# Patient Record
Sex: Male | Born: 1935 | Race: White | Hispanic: No | Marital: Married | Smoking: Former smoker
Health system: Southern US, Community
[De-identification: ages and names within clinical notes are randomized; demographics above are authoritative.]

## PROBLEM LIST (undated history)

## (undated) DIAGNOSIS — K5792 Diverticulitis of intestine, part unspecified, without perforation or abscess without bleeding: Secondary | ICD-10-CM

## (undated) DIAGNOSIS — C439 Malignant melanoma of skin, unspecified: Secondary | ICD-10-CM

## (undated) DIAGNOSIS — C9200 Acute myeloblastic leukemia, not having achieved remission: Principal | ICD-10-CM

## (undated) DIAGNOSIS — C4491 Basal cell carcinoma of skin, unspecified: Secondary | ICD-10-CM

## (undated) HISTORY — DX: Malignant melanoma of skin, unspecified: C43.9

## (undated) HISTORY — DX: Basal cell carcinoma of skin, unspecified: C44.91

## (undated) HISTORY — PX: CATARACT EXTRACTION W/ INTRAOCULAR LENS  IMPLANT, BILATERAL: SHX1307

## (undated) HISTORY — DX: Acute myeloblastic leukemia, not having achieved remission: C92.00

## (undated) HISTORY — PX: OTHER SURGICAL HISTORY: SHX169

## (undated) HISTORY — DX: Diverticulitis of intestine, part unspecified, without perforation or abscess without bleeding: K57.92

---
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
## 2000-07-23 ENCOUNTER — Emergency Department (HOSPITAL_COMMUNITY): Admission: EM | Admit: 2000-07-23 | Discharge: 2000-07-23 | Payer: Self-pay

## 2003-10-25 DIAGNOSIS — C4491 Basal cell carcinoma of skin, unspecified: Secondary | ICD-10-CM

## 2003-10-25 HISTORY — DX: Basal cell carcinoma of skin, unspecified: C44.91

## 2007-05-14 ENCOUNTER — Ambulatory Visit: Payer: Self-pay | Admitting: Internal Medicine

## 2007-05-28 ENCOUNTER — Ambulatory Visit: Payer: Self-pay | Admitting: Internal Medicine

## 2007-05-28 ENCOUNTER — Encounter: Payer: Self-pay | Admitting: Internal Medicine

## 2007-11-23 ENCOUNTER — Ambulatory Visit: Payer: Self-pay | Admitting: Vascular Surgery

## 2011-03-08 NOTE — Procedures (Signed)
CAROTID DUPLEX EXAM   INDICATION:  Followup evaluation of known carotid artery disease.   HISTORY:  Diabetes:  No.  Cardiac:  No.  Hypertension:  No.  Smoking:  Periodic smoking of cigars.  Previous Surgery:  No.  CV History:  The patient reports no cerebrovascular symptoms, however,  had an abnormal lifeline screening examination performed 3 years ago.  Amaurosis Fugax No, Paresthesias No, Hemiparesis No                                       RIGHT             LEFT  Brachial systolic pressure:         130               126  Brachial Doppler waveforms:         Triphasic         Triphasic  Vertebral direction of flow:        Antegrade         Antegrade  DUPLEX VELOCITIES (cm/sec)  CCA peak systolic                   103               132  ECA peak systolic                   75                47  ICA peak systolic                   56                49  ICA end diastolic                   9                 12  PLAQUE MORPHOLOGY:                  Soft              Soft  PLAQUE AMOUNT:                      Minimal           Minimal  PLAQUE LOCATION:                    CCA, proximal ICA proximal ICA, CCA   IMPRESSION:  20-39% ICA stenosis bilaterally.   ___________________________________________  Quita Skye Hart [place], M.D.   MC/MEDQ  D:  11/23/2007  T:  11/24/2007  Job:  [id number]

## 2011-06-13 ENCOUNTER — Other Ambulatory Visit: Payer: Self-pay | Admitting: Dermatology

## 2012-06-20 ENCOUNTER — Telehealth: Payer: Self-pay | Admitting: Internal Medicine

## 2012-06-20 NOTE — Telephone Encounter (Signed)
Discussed with pt that his last colon was done in 2008 and it had been 5 years. Pt scheduled for previsit 07/03/12@3 :30pm, colon scheduled for 07/17/12@3 :30pm. Pt aware of appt dates and times.

## 2012-07-03 ENCOUNTER — Ambulatory Visit (AMBULATORY_SURGERY_CENTER): Payer: Medicare Other | Admitting: *Deleted

## 2012-07-03 VITALS — Ht 71.0 in | Wt 165.0 lb

## 2012-07-03 DIAGNOSIS — Z1211 Encounter for screening for malignant neoplasm of colon: Secondary | ICD-10-CM

## 2012-07-03 MED ORDER — MOVIPREP 100 G PO SOLR: ORAL | Status: DC

## 2012-07-16 ENCOUNTER — Telehealth: Payer: Self-pay | Admitting: *Deleted

## 2012-07-16 NOTE — Telephone Encounter (Signed)
Moved patient to an earlier available appointment.  Pt now scheduled to arrive at 13:30 for a 14:30 appointment.  Pt aware that he must start drinking his 2nd dose of his prep an hour earlier than he was originally instructed to.

## 2012-07-17 ENCOUNTER — Encounter: Payer: Medicare Other | Admitting: Internal Medicine

## 2012-07-17 ENCOUNTER — Ambulatory Visit (AMBULATORY_SURGERY_CENTER): Payer: Medicare Other | Admitting: Internal Medicine

## 2012-07-17 ENCOUNTER — Encounter: Payer: Self-pay | Admitting: Internal Medicine

## 2012-07-17 VITALS — BP 130/91 | HR 58 | Temp 97.5°F | Resp 18 | Ht 71.0 in | Wt 165.0 lb

## 2012-07-17 DIAGNOSIS — Z86010 Personal history of colonic polyps: Secondary | ICD-10-CM

## 2012-07-17 DIAGNOSIS — Z1211 Encounter for screening for malignant neoplasm of colon: Secondary | ICD-10-CM

## 2012-07-17 DIAGNOSIS — D126 Benign neoplasm of colon, unspecified: Secondary | ICD-10-CM

## 2012-07-17 DIAGNOSIS — K635 Polyp of colon: Secondary | ICD-10-CM

## 2012-07-17 MED ORDER — SODIUM CHLORIDE 0.9 % IV SOLN: 500.0000 mL | INTRAVENOUS | Status: DC

## 2012-07-17 NOTE — Progress Notes (Signed)
DR. Marina Goodell AND MARK SMITH CRNA MADE AWARE PRIOR TO PT. GOING TO PROCEDURE ROOM, THAT PT. DRUNK TEA AT 12:30.

## 2012-07-17 NOTE — Progress Notes (Addendum)
Patient did not have preoperative order for IV antibiotic SSI prophylaxis. (G8918)  Patient did not experience any of the following events: a burn prior to discharge; a fall within the facility; wrong site/side/patient/procedure/implant event; or a hospital transfer or hospital admission upon discharge from the facility. (G8907)  

## 2012-07-17 NOTE — Patient Instructions (Addendum)
YOU HAD AN ENDOSCOPIC PROCEDURE TODAY AT THE [facility]: Refer to the procedure report that was given to you for any specific questions about what was found during the examination.  If the procedure report does not answer your questions, please call your gastroenterologist to clarify.  If you requested that your care partner not be given the details of your procedure findings, then the procedure report has been included in a sealed envelope for you to review at your convenience later.  YOU SHOULD EXPECT: Some feelings of bloating in the abdomen. Passage of more gas than usual.  Walking can help get rid of the air that was put into your GI tract during the procedure and reduce the bloating. If you had a lower endoscopy (such as a colonoscopy or flexible sigmoidoscopy) you may notice spotting of blood in your stool or on the toilet paper. If you underwent a bowel prep for your procedure, then you may not have a normal bowel movement for a few days.  DIET: Your first meal following the procedure should be a light meal and then it is ok to progress to your normal diet.  A half-sandwich or bowl of soup is an example of a good first meal.  Heavy or fried foods are harder to digest and may make you feel nauseous or bloated.  Likewise meals heavy in dairy and vegetables can cause extra gas to form and this can also increase the bloating.  Drink plenty of fluids but you should avoid alcoholic beverages for 24 hours.  ACTIVITY: Your care partner should take you home directly after the procedure.  You should plan to take it easy, moving slowly for the rest of the day.  You can resume normal activity the day after the procedure however you should NOT DRIVE or use heavy machinery for 24 hours (because of the sedation medicines used during the test).    SYMPTOMS TO REPORT IMMEDIATELY: A gastroenterologist can be reached at any hour.  During normal business hours, 8:30 AM to 5:00 PM Monday through Friday,  call [phone].  After hours and on weekends, please call the GI answering service at [phone] who will take a message and have the physician on call contact you.   Following lower endoscopy (colonoscopy or flexible sigmoidoscopy):  Excessive amounts of blood in the stool  Significant tenderness or worsening of abdominal pains  Swelling of the abdomen that is new, acute  Fever of 100F or higher  FOLLOW UP: If any biopsies were taken you will be contacted by phone or by letter within the next 1-3 weeks.  Call your gastroenterologist if you have not heard about the biopsies in 3 weeks.  Our staff will call the home number listed on your records the next business day following your procedure to check on you and address any questions or concerns that you may have at that time regarding the information given to you following your procedure. This is a courtesy call and so if there is no answer at the home number and we have not heard from you through the emergency physician on call, we will assume that you have returned to your regular daily activities without incident.  SIGNATURES/CONFIDENTIALITY: You and/or your care partner have signed paperwork which will be entered into your electronic medical record.  These signatures attest to the fact that that the information above on your After Visit Summary has been reviewed and is understood.  Full responsibility of the confidentiality of this   discharge information lies with you and/or your care-partner.   Thank-you for choosing us for your healthcare needs. 

## 2012-07-17 NOTE — Op Note (Signed)
[facility] [address] [place], [address]   COLONOSCOPY PROCEDURE REPORT  PATIENT: [name], [name]  MR#: [id number] BIRTHDATE: 02-19-1928 , 83  yrs. old GENDER: Male ENDOSCOPIST: Roxy Cedar, MD REFERRED XB:[id number] Program Recall PROCEDURE DATE:  07/17/2012 PROCEDURE:   Colonoscopy with snare polypectomy    x 4 ASA CLASS:   Class II INDICATIONS:patient's personal history of adenomatous colon polyps.  MEDICATIONS: MAC sedation, administered by CRNA and propofol (Diprivan) 80mg  IV  DESCRIPTION OF PROCEDURE:   After the risks benefits and alternatives of the procedure were thoroughly explained, informed consent was obtained.  A digital rectal exam revealed no abnormalities of the rectum.   The LB CF-[id number]  endoscope was introduced through the anus and advanced to the cecum, which was identified by both the appendix and ileocecal valve. No adverse events experienced.   The quality of the prep was adequate, using MoviPrep  The instrument was then slowly withdrawn as the colon was fully examined.      COLON FINDINGS: Four polyps ranging between 3-27mm in size were found in the ascending colon and transverse colon.  A polypectomy was performed with a cold snare.  The resection was complete and the polyp tissue was completely retrieved.   Severe diverticulosis was noted throughout the entire examined colon.  Retroflexed views revealed no abnormalities. The time to cecum=2 minutes 15 seconds. Withdrawal time=13 minutes 09 seconds.  The scope was withdrawn and the procedure completed. COMPLICATIONS: There were no complications.  ENDOSCOPIC IMPRESSION: 1.   Four polyps ranging between 3-21mm in size were found in the ascending colon and transverse colon; polypectomy was performed with a cold snare 2.   Severe diverticulosis was noted throughout the entire examined colon  RECOMMENDATIONS: 1. Return to the care of your primary provider.  GI  follow up as needed   eSigned:  Roxy Cedar, MD 07/17/2012 2:21 PM   cc: Geoffry Paradise, MD and The Patient   PATIENT NAME:  Shawn Goodman, Shawn Goodman MR#: [id number]

## 2012-07-18 ENCOUNTER — Telehealth: Payer: Self-pay | Admitting: *Deleted

## 2012-07-18 NOTE — Telephone Encounter (Signed)
Left message that we called for f/u 

## 2012-07-23 ENCOUNTER — Encounter: Payer: Self-pay | Admitting: Internal Medicine

## 2012-11-05 ENCOUNTER — Other Ambulatory Visit: Payer: Self-pay | Admitting: Dermatology

## 2012-11-24 DIAGNOSIS — C439 Malignant melanoma of skin, unspecified: Secondary | ICD-10-CM

## 2012-11-24 HISTORY — DX: Malignant melanoma of skin, unspecified: C43.9

## 2012-12-22 DIAGNOSIS — C9200 Acute myeloblastic leukemia, not having achieved remission: Secondary | ICD-10-CM

## 2012-12-22 HISTORY — DX: Acute myeloblastic leukemia, not having achieved remission: C92.00

## 2013-01-05 ENCOUNTER — Emergency Department (HOSPITAL_BASED_OUTPATIENT_CLINIC_OR_DEPARTMENT_OTHER): Payer: Medicare Other

## 2013-01-05 ENCOUNTER — Inpatient Hospital Stay (HOSPITAL_BASED_OUTPATIENT_CLINIC_OR_DEPARTMENT_OTHER)
Admission: EM | Admit: 2013-01-05 | Discharge: 2013-01-10 | DRG: 834 | Disposition: A | Discharge status: Other Acute Inpatient Hospital | Payer: Medicare Other | Attending: Internal Medicine | Admitting: Internal Medicine

## 2013-01-05 ENCOUNTER — Encounter (HOSPITAL_BASED_OUTPATIENT_CLINIC_OR_DEPARTMENT_OTHER): Payer: Self-pay | Admitting: *Deleted

## 2013-01-05 DIAGNOSIS — D649 Anemia, unspecified: Secondary | ICD-10-CM

## 2013-01-05 DIAGNOSIS — N400 Enlarged prostate without lower urinary tract symptoms: Secondary | ICD-10-CM | POA: Diagnosis present

## 2013-01-05 DIAGNOSIS — R509 Fever, unspecified: Secondary | ICD-10-CM

## 2013-01-05 DIAGNOSIS — N390 Urinary tract infection, site not specified: Secondary | ICD-10-CM

## 2013-01-05 DIAGNOSIS — R5081 Fever presenting with conditions classified elsewhere: Secondary | ICD-10-CM | POA: Diagnosis present

## 2013-01-05 DIAGNOSIS — J322 Chronic ethmoidal sinusitis: Secondary | ICD-10-CM

## 2013-01-05 DIAGNOSIS — Z87891 Personal history of nicotine dependence: Secondary | ICD-10-CM

## 2013-01-05 DIAGNOSIS — C4330 Malignant melanoma of unspecified part of face: Secondary | ICD-10-CM | POA: Diagnosis present

## 2013-01-05 DIAGNOSIS — C9200 Acute myeloblastic leukemia, not having achieved remission: Principal | ICD-10-CM | POA: Diagnosis present

## 2013-01-05 DIAGNOSIS — J189 Pneumonia, unspecified organism: Secondary | ICD-10-CM

## 2013-01-05 DIAGNOSIS — D709 Neutropenia, unspecified: Secondary | ICD-10-CM

## 2013-01-05 LAB — COMPREHENSIVE METABOLIC PANEL
ALT: 14 U/L (ref 0–53)
AST: 16 U/L (ref 0–37)
Albumin: 3.3 g/dL — ABNORMAL LOW (ref 3.5–5.2)
CO2: 24 mEq/L (ref 19–32)
Calcium: 9.1 mg/dL (ref 8.4–10.5)
Chloride: 100 mEq/L (ref 96–112)
Creatinine, Ser: 1.1 mg/dL (ref 0.50–1.35)
Sodium: 136 mEq/L (ref 135–145)
Total Bilirubin: 0.3 mg/dL (ref 0.3–1.2)

## 2013-01-05 LAB — CBC WITH DIFFERENTIAL/PLATELET
Basophils Relative: 1 % (ref 0–1)
Eosinophils Relative: 3 % (ref 0–5)
Lymphocytes Relative: 67 % — ABNORMAL HIGH (ref 12–46)
MCV: 113.8 fL — ABNORMAL HIGH (ref 78.0–100.0)
Monocytes Absolute: 0.1 10*3/uL (ref 0.1–1.0)
Neutro Abs: 0.4 10*3/uL — ABNORMAL LOW (ref 1.7–7.7)
Neutrophils Relative %: 24 % — ABNORMAL LOW (ref 43–77)
Platelets: 173 10*3/uL (ref 150–400)
RDW: 13.5 % (ref 11.5–15.5)
WBC: 1.5 10*3/uL — ABNORMAL LOW (ref 4.0–10.5)

## 2013-01-05 LAB — URINALYSIS, ROUTINE W REFLEX MICROSCOPIC
Glucose, UA: NEGATIVE mg/dL
Hgb urine dipstick: NEGATIVE
Specific Gravity, Urine: 1.027 (ref 1.005–1.030)
Urobilinogen, UA: 1 mg/dL (ref 0.0–1.0)

## 2013-01-05 LAB — URINE MICROSCOPIC-ADD ON

## 2013-01-05 MED ORDER — SODIUM CHLORIDE 0.9 % IV SOLN
Freq: Once | INTRAVENOUS | Status: AC
  Administered 2013-01-05: via INTRAVENOUS

## 2013-01-05 MED ORDER — PIPERACILLIN-TAZOBACTAM 3.375 G IVPB
3.3750 g | Freq: Once | INTRAVENOUS | Status: AC
  Administered 2013-01-06: 3.375 g via INTRAVENOUS
  Filled 2013-01-05: qty 50

## 2013-01-05 MED ORDER — VANCOMYCIN HCL IN DEXTROSE 1-5 GM/200ML-% IV SOLN
1000.0000 mg | Freq: Once | INTRAVENOUS | Status: AC
  Administered 2013-01-05: 1000 mg via INTRAVENOUS
  Filled 2013-01-05: qty 200

## 2013-01-05 MED ORDER — DEXTROSE 5 % IV SOLN: 1.0000 g | INTRAVENOUS | Status: DC

## 2013-01-05 NOTE — ED Provider Notes (Signed)
History     CSN: [id number]  Arrival date & time 01/05/13  2005   First MD Initiated Contact with Patient 01/05/13 2026      Chief Complaint  Patient presents with  . Fever    (Consider location/radiation/quality/duration/timing/severity/associated sxs/prior treatment) Patient is a 79 y.o. male presenting with fever. The history is provided by the patient. No language interpreter was used.  Fever Max temp prior to arrival:  100 Temp source:  Oral Severity:  Mild Onset quality:  Sudden Timing:  Constant Progression:  Worsening Chronicity:  New Relieved by:  Nothing Worsened by:  Nothing tried Associated symptoms: no dysuria     History reviewed. No pertinent past medical history.  Past Surgical History  Procedure Laterality Date  . Cataract extraction w/ intraocular lens  implant, bilateral    . Inguinal hernia repair  1953  . 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 Tonsillectomy and adenoidectomy  1939  . Melanoma removal      Family History  Problem Relation Age of Onset  . Heart disease Father     History  Substance Use Topics  . Smoking status: Former Smoker    Types: Pipe, Software engineer  . Smokeless tobacco: Never Used  . Alcohol Use: No     Comment: rarely wine      Review of Systems  Constitutional: Positive for fever.  Genitourinary: Negative for dysuria.  All other systems reviewed and are negative.    Allergies  Review of patient's allergies indicates no known allergies.  Home Medications   Current Outpatient Rx  Name  Route  Sig  Dispense  Refill  . MOVIPREP 100 G SOLR      MOVI PREP take as directed no substitution   1 kit   0     Dispense as written.   . Multiple Vitamins-Minerals (MULTI FOR HIM 50+ PO)   Oral   Take 1 tablet by mouth daily. [employer] pack/day           BP 139/54  Pulse 80  Temp(Src) 98.8 F (37.1 C) (Oral)  Resp 18  Wt 161 lb 3 oz (73.114 kg)  BMI 22.49 kg/m2  SpO2 97%  Physical Exam  Nursing note  and vitals reviewed. Constitutional: He is oriented to person, place, and time. He appears well-developed and well-nourished.  HENT:  Head: Normocephalic.  Right Ear: External ear normal.  Left Ear: External ear normal.  Nose: Nose normal.  Mouth/Throat: Oropharynx is clear and moist.  Eyes: Conjunctivae and EOM are normal. Pupils are equal, round, and reactive to light.  Neck: Normal range of motion.  Cardiovascular: Normal rate and normal heart sounds.   Pulmonary/Chest: Effort normal and breath sounds normal.  Abdominal: Soft.  Musculoskeletal: Normal range of motion.  Neurological: He is alert and oriented to person, place, and time.  Skin: Skin is warm.  Psychiatric: He has a normal mood and affect.    ED Course  Procedures (including critical care time)  Labs Reviewed  URINALYSIS, ROUTINE W REFLEX MICROSCOPIC - Abnormal; Notable for the following:    Color, Urine AMBER (*)    Protein, ur 30 (*)    All other components within normal limits  URINE MICROSCOPIC-ADD ON - Abnormal; Notable for the following:    Bacteria, UA MANY (*)    All other components within normal limits  CBC WITH DIFFERENTIAL - Abnormal; Notable for the following:    WBC 1.5 (*)    RBC 2.32 (*)  Hemoglobin 9.2 (*)    HCT 26.4 (*)    MCV 113.8 (*)    MCH 39.7 (*)    Neutrophils Relative 24 (*)    Lymphocytes Relative 67 (*)    Neutro Abs 0.4 (*)    All other components within normal limits  COMPREHENSIVE METABOLIC PANEL - Abnormal; Notable for the following:    Glucose, Bld 191 (*)    BUN 30 (*)    Albumin 3.3 (*)    GFR calc non Af Amer 61 (*)    GFR calc Af Amer 70 (*)    All other components within normal limits  CULTURE, BLOOD (ROUTINE X 2)  CULTURE, BLOOD (ROUTINE X 2)  PATHOLOGIST SMEAR REVIEW  LACTIC ACID, PLASMA   Dg Chest 2 View  01/05/2013  *RADIOLOGY REPORT*  Clinical Data: Fever for 1 day.  Fatigue.  Ex-smoker.  CHEST - 2 VIEW  Comparison: None.  Findings: Hyperinflation  suggesting emphysematous change.  There are patchy airspace infiltrates in both lower lungs posteriorly. Changes may represent focal pneumonia.  Normal heart size and pulmonary vascularity.  No blunting of costophrenic angles.  No pneumothorax.  Mediastinal contours appear intact.  Calcification of the aorta.  Mild degenerative changes in the thoracic spine.  IMPRESSION: Hyperinflation suggesting emphysematous change.  Patchy focal areas of infiltration in both lung bases may represent pneumonia.  Follow up after resolution of acute symptoms is recommend to exclude underlying mass lesion.   Original Report Authenticated By: Burman Nieves, M.D.      No diagnosis found.    MDM  Pt and family counseled on results.  IV antibiotics ordered.        Lonia Skinner [place], PA-C 01/05/13 2254

## 2013-01-05 NOTE — ED Provider Notes (Signed)
Medical screening examination/treatment/procedure(s) were conducted as a shared visit with non-physician practitioner(s) and myself.  I personally evaluated the patient during the encounter   Shawn Racer, MD 01/05/13 2318

## 2013-01-05 NOTE — ED Notes (Signed)
Pt c/o fever of 100 at home, runny nose and burning with urination.  Wife says pt is sleeping more often. Denies SOB/CP and sore throat.

## 2013-01-06 ENCOUNTER — Encounter (HOSPITAL_COMMUNITY): Payer: Self-pay | Admitting: Oncology

## 2013-01-06 DIAGNOSIS — D709 Neutropenia, unspecified: Secondary | ICD-10-CM

## 2013-01-06 DIAGNOSIS — C4330 Malignant melanoma of unspecified part of face: Secondary | ICD-10-CM

## 2013-01-06 DIAGNOSIS — D649 Anemia, unspecified: Secondary | ICD-10-CM

## 2013-01-06 DIAGNOSIS — D72819 Decreased white blood cell count, unspecified: Secondary | ICD-10-CM

## 2013-01-06 LAB — COMPREHENSIVE METABOLIC PANEL
ALT: 13 U/L (ref 0–53)
Alkaline Phosphatase: 43 U/L (ref 39–117)
BUN: 23 mg/dL (ref 6–23)
CO2: 25 mEq/L (ref 19–32)
GFR calc Af Amer: 88 mL/min — ABNORMAL LOW (ref >90)
GFR calc non Af Amer: 76 mL/min — ABNORMAL LOW (ref >90)
Glucose, Bld: 124 mg/dL — ABNORMAL HIGH (ref 70–99)
Potassium: 4 mEq/L (ref 3.5–5.1)
Sodium: 134 mEq/L — ABNORMAL LOW (ref 135–145)
Total Bilirubin: 0.3 mg/dL (ref 0.3–1.2)

## 2013-01-06 LAB — VITAMIN B12: Vitamin B-12: 409 pg/mL (ref 211–911)

## 2013-01-06 LAB — IRON AND TIBC: UIBC: 125 ug/dL (ref 125–400)

## 2013-01-06 LAB — RETICULOCYTES
RBC.: 2.12 MIL/uL — ABNORMAL LOW (ref 4.22–5.81)
Retic Count, Absolute: 27.6 10*3/uL (ref 19.0–186.0)
Retic Ct Pct: 1.3 % (ref 0.4–3.1)

## 2013-01-06 LAB — FOLATE: Folate: 19.6 ng/mL

## 2013-01-06 LAB — CBC
MCHC: 35.6 g/dL (ref 30.0–36.0)
Platelets: 159 10*3/uL (ref 150–400)
RDW: 14.1 % (ref 11.5–15.5)

## 2013-01-06 LAB — DIRECT ANTIGLOBULIN TEST (NOT AT ARMC): DAT, complement: NEGATIVE

## 2013-01-06 MED ORDER — ACETAMINOPHEN 650 MG RE SUPP: 650.0000 mg | Freq: Four times a day (QID) | RECTAL | Status: DC | PRN

## 2013-01-06 MED ORDER — POTASSIUM CHLORIDE IN NACL 20-0.9 MEQ/L-% IV SOLN
INTRAVENOUS | Status: DC
  Administered 2013-01-06 – 2013-01-08 (×3): via INTRAVENOUS
  Filled 2013-01-06 (×6): qty 1000

## 2013-01-06 MED ORDER — HYDROCODONE-ACETAMINOPHEN 5-325 MG PO TABS
1.0000 | ORAL_TABLET | ORAL | Status: DC | PRN
Start: 2013-01-06 — End: 2013-01-10

## 2013-01-06 MED ORDER — ALUM & MAG HYDROXIDE-SIMETH 200-200-20 MG/5ML PO SUSP: 30.0000 mL | Freq: Four times a day (QID) | ORAL | Status: DC | PRN

## 2013-01-06 MED ORDER — ZOLPIDEM TARTRATE 5 MG PO TABS: 5.0000 mg | ORAL_TABLET | Freq: Every evening | ORAL | Status: DC | PRN

## 2013-01-06 MED ORDER — PROMETHAZINE HCL 25 MG PO TABS: 12.5000 mg | ORAL_TABLET | Freq: Four times a day (QID) | ORAL | Status: DC | PRN

## 2013-01-06 MED ORDER — BISACODYL 10 MG RE SUPP: 10.0000 mg | Freq: Every day | RECTAL | Status: DC | PRN

## 2013-01-06 MED ORDER — DEXTROSE 5 % IV SOLN
1.0000 g | Freq: Once | INTRAVENOUS | Status: AC
  Administered 2013-01-06: 1 g via INTRAVENOUS
  Filled 2013-01-06: qty 10

## 2013-01-06 MED ORDER — ADULT MULTIVITAMIN W/MINERALS CH
1.0000 | ORAL_TABLET | Freq: Every morning | ORAL | Status: DC
  Administered 2013-01-06 – 2013-01-10 (×5): 1 via ORAL
  Filled 2013-01-06 (×5): qty 1

## 2013-01-06 MED ORDER — SENNOSIDES-DOCUSATE SODIUM 8.6-50 MG PO TABS
1.0000 | ORAL_TABLET | Freq: Every evening | ORAL | Status: DC | PRN
  Filled 2013-01-06: qty 1

## 2013-01-06 MED ORDER — PIPERACILLIN-TAZOBACTAM 3.375 G IVPB
3.3750 g | Freq: Three times a day (TID) | INTRAVENOUS | Status: DC
  Administered 2013-01-06 – 2013-01-10 (×13): 3.375 g via INTRAVENOUS
  Filled 2013-01-06 (×14): qty 50

## 2013-01-06 MED ORDER — VANCOMYCIN HCL IN DEXTROSE 1-5 GM/200ML-% IV SOLN
1000.0000 mg | Freq: Two times a day (BID) | INTRAVENOUS | Status: DC
  Administered 2013-01-06 – 2013-01-10 (×9): 1000 mg via INTRAVENOUS
  Filled 2013-01-06 (×10): qty 200

## 2013-01-06 MED ORDER — ENOXAPARIN SODIUM 40 MG/0.4ML ~~LOC~~ SOLN
40.0000 mg | SUBCUTANEOUS | Status: DC
  Administered 2013-01-06 – 2013-01-09 (×3): 40 mg via SUBCUTANEOUS
  Filled 2013-01-06 (×5): qty 0.4

## 2013-01-06 MED ORDER — ACETAMINOPHEN 325 MG PO TABS
650.0000 mg | ORAL_TABLET | Freq: Four times a day (QID) | ORAL | Status: DC | PRN
  Administered 2013-01-07: 650 mg via ORAL
  Filled 2013-01-06: qty 2

## 2013-01-06 NOTE — Progress Notes (Signed)
ANTIBIOTIC CONSULT NOTE - INITIAL  Pharmacy Consult for Vancomycin Indication: suspected PNA, neutropenic fever  No Known Allergies  Patient Measurements: Height: 5\' 11"  (180.3 cm) Weight: 158 lb 8.2 oz (71.9 kg) IBW/kg (Calculated) : 75.3  Vital Signs: Temp: 98.1 F (36.7 C) (03/16 0158) Temp src: Oral (03/16 0158) BP: 165/63 mmHg (03/16 0158) Pulse Rate: 70 (03/16 0158) Intake/Output from previous day: 03/15 0701 - 03/16 0700 In: -  Out: 250 [Urine:250] Intake/Output from this shift:    Labs:  Recent Labs  01/05/13 2112 01/06/13 0403  WBC 1.5* 1.4*  HGB 9.2* 8.5*  PLT 173 159  CREATININE 1.10 0.94   Estimated Creatinine Clearance: 61.6 ml/min (by C-G formula based on Cr of 0.94). No results found for this basename: VANCOTROUGH, VANCOPEAK, VANCORANDOM, GENTTROUGH, GENTPEAK, GENTRANDOM, TOBRATROUGH, TOBRAPEAK, TOBRARND, AMIKACINPEAK, AMIKACINTROU, AMIKACIN,  in the last 72 hours   Microbiology: No results found for this or any previous visit (from the past 720 hour(s)).  Medical History: History reviewed. No pertinent past medical history.   Assessment:  73 yom with not much history available in [facility] presented 3/15 with c/o fever. CBC with WBC of 1.5K, ANC 0.4K. CXR with infiltrate c/w PNA.  MD ordered Vancomycin per pharmacy (also has Zosyn on board) for presumed PNA in a neutropenic patient.  Patient received a dose of Vancomycin 1gm in the ED at 2300 on 3/15.   Pt is afebrile since admit, Scr 0.94 for CG CrCl and normalized CrCl of 62 ml/min. Blood cultures pending.  Goal of Therapy:  Vancomycin trough level 15-20 mcg/ml  Plan:   Vancomycin 1gm IV q12h, first dose to start at 1000.   Pharmacy will f/u  Geoffry Paradise, PharmD, BCPS Pager: [phone] 9:45 AM Pharmacy #: [phone]

## 2013-01-06 NOTE — Progress Notes (Signed)
CRITICAL VALUE ALERT  Critical value received:  WBC 1.4  Date of notification:  01/06/13  Time of notification:  0459  Critical value read back:yes  Nurse who received alert:  Oneita Jolly, RN  MD notified (1st page):  Elray Mcgregor, NP   Time of first page:  [phone]  MD notified (2nd page):  Time of second page:  Responding MD:  Elray Mcgregor, NP  Time MD responded:  [phone], acknowledged no new orders at this time.

## 2013-01-06 NOTE — H&P (Addendum)
Shawn Goodman is an 79 y.o. male.   Chief Complaint: fever and weakness HPI:  Patient is an 79 year old Caucasian man with a fairly unremarkable past medical history who presented to the emergency room on my advice after a few days of progressive fatigue and intermittent fevers. He has not had productive cough, shortness of breath, chest pain, nausea, abdominal pain, dysuria, or frequency. 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 At his annual physical exam last month he had anemia with leukopenia and a predominance of lymphocytes. The workup in the emergency room was significant for bibasilar patchy airspace disease, bacteriuria, and severe leukopenia with moderately severe anemia and neutropenia. He was started on broad-spectrum antibiotics and admitted for further evaluation. Currently he feels fine with no shortness of breath or cough his appetite is fair to good. Has no abdominal pain or nausea or vomiting. He is concerned about his wife who has severe problems with gait that requires his assistance. His daughter was with him at my exam and they're planning on some home health care assistance for now.  History reviewed. No pertinent past medical history. 2014 Melanoma resection from the nose (early stage) Benign Prostate Enlargement with elevated PSA levels (9-11 range), followed by [facility] urologist  Medications Prior to Admission  Medication Sig Dispense Refill  . Multiple Vitamins-Minerals (MULTI FOR HIM 50+ PO) Take 1 tablet by mouth daily. [employer] pack/day        ADDITIONAL HOME MEDICATIONS: No additional medications  PHYSICIANS INVOLVED IN CARE: Geoffry Paradise (primary care), Ellen Henri [facility] surgeon)  Past Surgical History  Procedure Laterality Date  . Cataract extraction w/ intraocular lens  implant, bilateral    . Inguinal hernia repair  1953  . 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 Tonsillectomy and adenoidectomy  1939  . Melanoma removal      Family History  Problem Relation Age of Onset  . Heart disease Father      Social History:  reports that he has quit smoking. His smoking use included Pipe and Cigars. He has never used smokeless tobacco. He reports that he does not drink alcohol or use illicit drugs.  Allergies: No Known Allergies   ROS: anemia, cataracts and Urinary hesitancy  PHYSICAL EXAM: Blood pressure 165/63, pulse 70, temperature 98.1 F (36.7 C), temperature source Oral, resp. rate 16, height 5\' 11"  (1.803 m), weight 71.9 kg (158 lb 8.2 oz), SpO2 100.00%. In general, the patient is an elderly white man who was in no apparent distress was sitting upright in bed just having finished breakfast. HEENT exam was significant for a healing Mohs surgery on his nose with no evidence of infection. Neck was supple without jugular venous distention and there was a soft left-sided carotid bruit, chest had minimal bibasilar crackles, heart had a regular rate and rhythm with a systolic ejection murmur grade 1/6 the left sternal border, abdomen had normal bowel sounds and no hepatosplenomegaly or tenderness, extremities were without cyanosis, clubbing, or edema. Neurologic exam was nonfocal.  Results for orders placed during the hospital encounter of 01/05/13 (from the past 48 hour(s))  URINALYSIS, ROUTINE W REFLEX MICROSCOPIC     Status: Abnormal   Collection Time    01/05/13  8:26 PM      Result Value Range   Color, Urine AMBER (*) YELLOW   Comment: BIOCHEMICALS MAY BE AFFECTED BY COLOR   APPearance CLEAR  CLEAR  Specific Gravity, Urine 1.027  1.005 - 1.030   pH 5.5  5.0 - 8.0   Glucose, UA NEGATIVE  NEGATIVE mg/dL   Hgb urine dipstick NEGATIVE  NEGATIVE   Bilirubin Urine NEGATIVE  NEGATIVE   Ketones, ur NEGATIVE  NEGATIVE mg/dL   Protein, ur 30 (*) NEGATIVE mg/dL   Urobilinogen, UA 1.0  0.0 - 1.0 mg/dL   Nitrite NEGATIVE  NEGATIVE   Leukocytes, UA  NEGATIVE  NEGATIVE  URINE MICROSCOPIC-ADD ON     Status: Abnormal   Collection Time    01/05/13  8:26 PM      Result Value Range   Squamous Epithelial / LPF RARE  RARE   WBC, UA 0-2  <3 WBC/hpf   Bacteria, UA MANY (*) RARE   Urine-Other MUCOUS PRESENT    CBC WITH DIFFERENTIAL     Status: Abnormal   Collection Time    01/05/13  9:12 PM      Result Value Range   WBC 1.5 (*) 4.0 - 10.5 K/uL   Comment: REPEATED TO VERIFY   RBC 2.32 (*) 4.22 - 5.81 MIL/uL   Hemoglobin 9.2 (*) 13.0 - 17.0 g/dL   HCT 56.2 (*) 13.0 - 86.5 %   MCV 113.8 (*) 78.0 - 100.0 fL   MCH 39.7 (*) 26.0 - 34.0 pg   MCHC 34.8  30.0 - 36.0 g/dL   RDW 78.4  69.6 - 29.5 %   Platelets 173  150 - 400 K/uL   Neutrophils Relative 24 (*) 43 - 77 %   Lymphocytes Relative 67 (*) 12 - 46 %   Monocytes Relative 5  3 - 12 %   Eosinophils Relative 3  0 - 5 %   Basophils Relative 1  0 - 1 %   Neutro Abs 0.4 (*) 1.7 - 7.7 K/uL   Lymphs Abs 1.0  0.7 - 4.0 K/uL   Monocytes Absolute 0.1  0.1 - 1.0 K/uL   Eosinophils Absolute 0.0  0.0 - 0.7 K/uL   Basophils Absolute 0.0  0.0 - 0.1 K/uL   RBC Morphology POLYCHROMASIA PRESENT     Comment: SPHEROCYTES   WBC Morphology ATYPICAL LYMPHOCYTES     Comment: WHITE COUNT CONFIRMED ON SMEAR   Smear Review PLATELET COUNT CONFIRMED BY SMEAR     Comment: PENDING PATHOLOGIST REVIEW  COMPREHENSIVE METABOLIC PANEL     Status: Abnormal   Collection Time    01/05/13  9:12 PM      Result Value Range   Sodium 136  135 - 145 mEq/L   Potassium 4.2  3.5 - 5.1 mEq/L   Chloride 100  96 - 112 mEq/L   CO2 24  19 - 32 mEq/L   Glucose, Bld 191 (*) 70 - 99 mg/dL   BUN 30 (*) 6 - 23 mg/dL   Creatinine, Ser 2.84  0.50 - 1.35 mg/dL   Calcium 9.1  8.4 - 13.2 mg/dL   Total Protein 6.4  6.0 - 8.3 g/dL   Albumin 3.3 (*) 3.5 - 5.2 g/dL   AST 16  0 - 37 U/L   ALT 14  0 - 53 U/L   Alkaline Phosphatase 60  39 - 117 U/L   Total Bilirubin 0.3  0.3 - 1.2 mg/dL   GFR calc non Af Amer 61 (*) >90 mL/min   GFR calc  Af Amer 70 (*) >90 mL/min   Comment:            The eGFR has been  calculated     using the CKD EPI equation.     This calculation has not been     validated in all clinical     situations.     eGFR's persistently     <90 mL/min signify     possible Chronic Kidney Disease.  LACTIC ACID, PLASMA     Status: None   Collection Time    01/05/13 10:34 PM      Result Value Range   Lactic Acid, Venous 1.0  0.5 - 2.2 mmol/L  COMPREHENSIVE METABOLIC PANEL     Status: Abnormal   Collection Time    01/06/13  4:03 AM      Result Value Range   Sodium 134 (*) 135 - 145 mEq/L   Potassium 4.0  3.5 - 5.1 mEq/L   Chloride 100  96 - 112 mEq/L   CO2 25  19 - 32 mEq/L   Glucose, Bld 124 (*) 70 - 99 mg/dL   BUN 23  6 - 23 mg/dL   Creatinine, Ser 2.95  0.50 - 1.35 mg/dL   Calcium 8.2 (*) 8.4 - 10.5 mg/dL   Total Protein 5.7 (*) 6.0 - 8.3 g/dL   Albumin 2.7 (*) 3.5 - 5.2 g/dL   AST 14  0 - 37 U/L   ALT 13  0 - 53 U/L   Alkaline Phosphatase 43  39 - 117 U/L   Total Bilirubin 0.3  0.3 - 1.2 mg/dL   GFR calc non Af Amer 76 (*) >90 mL/min   GFR calc Af Amer 88 (*) >90 mL/min   Comment:            The eGFR has been calculated     using the CKD EPI equation.     This calculation has not been     validated in all clinical     situations.     eGFR's persistently     <90 mL/min signify     possible Chronic Kidney Disease.  CBC     Status: Abnormal   Collection Time    01/06/13  4:03 AM      Result Value Range   WBC 1.4 (*) 4.0 - 10.5 K/uL   Comment: CRITICAL RESULT CALLED TO, READ BACK BY AND VERIFIED WITH:     WARD,K.RN @0456  01/06/13 WELLS,D.   RBC 2.18 (*) 4.22 - 5.81 MIL/uL   Hemoglobin 8.5 (*) 13.0 - 17.0 g/dL   HCT 62.1 (*) 30.8 - 65.7 %   MCV 109.6 (*) 78.0 - 100.0 fL   MCH 39.0 (*) 26.0 - 34.0 pg   MCHC 35.6  30.0 - 36.0 g/dL   RDW 84.6  96.2 - 95.2 %   Platelets 159  150 - 400 K/uL   December 10, 2012 Office Lab results: WBC was 2.5 with 74.5% lymph, 19.6% granulocytes with  hemoglobin 11.4, hematocrit 33.6 %, MCV 116.9, platelets 342, sodium 140, potassium 4.4, chloride 106, carbon dioxide 28, BUN 25, creatinine 1.0, LFTs normal  Dec 02, 2011 Labs: WBC 5.70 with 37.6 lymph, 56 granulocytes, hemoglobin 14.5, hematocrit 42, platelets 169 with MCV 97.1  Nov 29, 2010 labs:  WBC 7.10 with 33 lymph, 58 granulocytes, hemoglobin 15, hematocrit 46, platelets 181 with MCV 94.4, PSA 11.72 (followed by urologist in [facility])  Dg Chest 2 View  01/05/2013  *RADIOLOGY REPORT*  Clinical Data: Fever for 1 day.  Fatigue.  Ex-smoker.  CHEST - 2 VIEW  Comparison: None.  Findings: Hyperinflation suggesting emphysematous  change.  There are patchy airspace infiltrates in both lower lungs posteriorly. Changes may represent focal pneumonia.  Normal heart size and pulmonary vascularity.  No blunting of costophrenic angles.  No pneumothorax.  Mediastinal contours appear intact.  Calcification of the aorta.  Mild degenerative changes in the thoracic spine.  IMPRESSION: Hyperinflation suggesting emphysematous change.  Patchy focal areas of infiltration in both lung bases may represent pneumonia.  Follow up after resolution of acute symptoms is recommend to exclude underlying mass lesion.   Original Report Authenticated By: Burman Nieves, M.D.      Assessment/Plan #1 Fever: Likely due to early pneumonia versus neutropenia. He shows no focal signs of infection, only chest x-ray abnormalities. He is clinically stable on vancomycin and Zosyn. Blood cultures and urine cultures have been sent. It is less likely that he has a urinary tract infection given the lack of significant white blood cells with his urinalysis. #2 Neutropenia and Anemia:  He has a concerning CBC with anemia with macrocytosis and extreme leukopenia. He also has a lymphocyte predominance with some atypical lymphs seen on smear. His blood abnormalities could be from early chronic lymphocytic leukemia, vitamin B 12 deficiency, or a viral  infection. We will check a serum B12 level as well as folate, and we will request a hematologist consultation. The pathologist review of the smear is pending.   Kambri Dismore G 01/06/2013, 9:39 AM

## 2013-01-06 NOTE — Progress Notes (Signed)
Utilization Review Completed.Shawn Goodman T3/16/2014  

## 2013-01-06 NOTE — Consult Note (Signed)
Hematology/ Oncology Consultation 01-06-2013   Reason for Consult:neutropenia and anemia Referring Physician: Jarome Matin  MDs: R.Kyung Rudd (urology, [place]), Shawn Goodman is an 79 y.o. WM seen in consultation at request of Dr Jarome Matin, with anemia and leukopenia/ neutropenia, which are reportedly of recent onset. He was admitted thru ED last PM with low grade fever (up to ~ 100) and possible early pneumonia, with hemoglobin 9.2, MCV 113.8, WBC 1.5 with ANC 0.4, platelets 173k. He was begun on zosyn and vancomycin, with blood cultures pending. He has been hemodynamically and clinically stable since admission, with repeat CBC 0400 today WBC 1.4, Hgb 8.5 and plt 159k.  Patient has been followed primarily by Dr Jacky Kindle for years. He had blood counts done with yearly physical exam by Dr Jacky Kindle ~ a month ago, which Dr Eloise Harman will include in an addendum in this EMR later today; verbal report was WBC ~ 2.8 then, primarily lymphocytes.  Patient had not been aware of any blood count abnormalities prior to a month ago. He felt well as usual until this past week, when he was sleeping more than usual and had temperatures 99.4 - 100 without clear localizing symptoms of infection. He has specifically had no cough, SOB, other lower respiratory symptoms and denies DOE. He has had slight increase in post nasal drainage with seasonal allergies, but no symptoms of sinusitis as he has had in past. BPH symptoms are unchanged, no clear UTI, no GI symptoms. He has had some dull aching across shoulders posteriorly, no other pain. He has had no bleeding. He had Moh's surgery with skin graft to nose ~ 2 weeks ago, healing well and not uncomfortable.  ROS as above, also: Weight steady at ~ 162, appetite good. No shaking chills with low fevers. No sore throat. Bowels ok. No exposure to known illness. No viral or other infectious illness in past several months.    Allergies: No Known  Allergies   Past Medical History BPH followed by urology in [facility] Up to date on colonoscopy, by Dr Yancey Flemings [phone] with no further colonoscopy recommended. History of sinus infections  Past Surgical History  Procedure Laterality Date  . Cataract extraction w/ intraocular lens  implant, bilateral    . Inguinal hernia repair  1953  . 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 Tonsillectomy and adenoidectomy  1939  . Melanoma removal     Hernia repair was done in [place] Family History  Problem Relation Age of Onset  . Heart disease Father     Social History:  Originally from [place], lives with wife in [place]. Wife wears leg braces and ambulates only a few feet with walker and support belt, otherwise uses transport chair; patient is primary caregiver for his wife. 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 One daughter in [place]; one son who was killed age 70. No tobacco or ETOH.   Medications: reviewed in EMR. He was on only vitamins PTA., no other new meds.  PHYSICAL EXAM Blood pressure 135/46, pulse 80, temperature 100 F (37.8 C), temperature source Oral, resp. rate 20, height 5\' 11"  (1.803 m), weight 158 lb 8.2 oz (71.9 kg), SpO2 96.00%. Pleasant, talkative gentleman, looks stated age, good historian, NAD. HEENT: PERRL,  not icteric. Oral mucosa moist and clear. Neck supple without JVD. Skin graft on bridge of nose appears to be healing well, donor site behind right ear not remarkable. Lungs without wheezes or rales Heart RRR, no gallop. Lymphatics: no adenopathy cervical, supraclavicular, bilateral axilla Abdomen: soft, protuberant, nontender, no HSM or mass appreciated, normal bowel sounds. LE no edema, cords, tenderness Skin with scattered nevi and  keratoses, no ecchymoses, no rash. Neuro: alert, oriented and appropriate. Moves easily in bed. No focal deficits.   Results for orders placed during the hospital encounter of 01/05/13 (from the past 48 hour(s))  URINALYSIS, ROUTINE W REFLEX MICROSCOPIC     Status: Abnormal   Collection Time    01/05/13  8:26 PM      Result Value Range   Color, Urine AMBER (*) YELLOW   Comment: BIOCHEMICALS MAY BE AFFECTED BY COLOR   APPearance CLEAR  CLEAR   Specific Gravity, Urine 1.027  1.005 - 1.030   pH 5.5  5.0 - 8.0   Glucose, UA NEGATIVE  NEGATIVE mg/dL   Hgb urine dipstick NEGATIVE  NEGATIVE   Bilirubin Urine NEGATIVE  NEGATIVE   Ketones, ur NEGATIVE  NEGATIVE mg/dL   Protein, ur 30 (*) NEGATIVE mg/dL   Urobilinogen, UA 1.0  0.0 - 1.0 mg/dL   Nitrite NEGATIVE  NEGATIVE   Leukocytes, UA NEGATIVE  NEGATIVE  URINE MICROSCOPIC-ADD ON     Status: Abnormal   Collection Time    01/05/13  8:26 PM      Result Value Range   Squamous Epithelial / LPF RARE  RARE   WBC, UA 0-2  <3 WBC/hpf   Bacteria, UA MANY (*) RARE   Urine-Other MUCOUS PRESENT    CBC WITH DIFFERENTIAL     Status: Abnormal   Collection Time    01/05/13  9:12 PM      Result Value Range   WBC 1.5 (*) 4.0 - 10.5 K/uL   Comment: REPEATED TO VERIFY   RBC 2.32 (*) 4.22 - 5.81 MIL/uL   Hemoglobin 9.2 (*) 13.0 - 17.0 g/dL   HCT 16.1 (*) 09.6 - 04.5 %   MCV 113.8 (*) 78.0 - 100.0 fL   MCH 39.7 (*) 26.0 - 34.0 pg   MCHC 34.8  30.0 - 36.0 g/dL   RDW 40.9  81.1 - 91.4 %   Platelets 173  150 - 400 K/uL   Neutrophils Relative 24 (*) 43 - 77 %   Lymphocytes Relative 67 (*) 12 - 46 %   Monocytes Relative 5  3 - 12 %   Eosinophils Relative 3  0 - 5 %   Basophils Relative 1  0 - 1 %   Neutro Abs 0.4 (*) 1.7 - 7.7 K/uL   Lymphs Abs 1.0  0.7 - 4.0 K/uL   Monocytes Absolute 0.1  0.1 - 1.0 K/uL   Eosinophils Absolute 0.0  0.0 - 0.7 K/uL   Basophils Absolute 0.0  0.0 - 0.1 K/uL   RBC Morphology POLYCHROMASIA PRESENT     Comment:  SPHEROCYTES   WBC Morphology ATYPICAL LYMPHOCYTES     Comment: WHITE COUNT CONFIRMED ON SMEAR   Smear Review PLATELET COUNT CONFIRMED BY SMEAR     Comment: PENDING PATHOLOGIST REVIEW  COMPREHENSIVE METABOLIC PANEL     Status: Abnormal   Collection Time    01/05/13  9:12 PM      Result Value Range   Sodium 136  135 - 145 mEq/L   Potassium 4.2  3.5 - 5.1 mEq/L   Chloride 100  96 - 112 mEq/L   CO2 24  19 - 32 mEq/L   Glucose, Bld 191 (*) 70 - 99 mg/dL   BUN 30 (*) 6 - 23 mg/dL   Creatinine, Ser 1.02  0.50 - 1.35 mg/dL   Calcium 9.1  8.4 - 72.5 mg/dL   Total Protein 6.4  6.0 - 8.3 g/dL   Albumin 3.3 (*) 3.5 - 5.2 g/dL   AST 16  0 - 37 U/L   ALT 14  0 - 53 U/L   Alkaline Phosphatase 60  39 - 117 U/L   Total Bilirubin 0.3  0.3 - 1.2 mg/dL   GFR calc non Af Amer 61 (*) >90 mL/min   GFR calc Af Amer 70 (*) >90 mL/min   Comment:            The eGFR has been calculated     using the CKD EPI equation.     This calculation has not been     validated in all clinical     situations.     eGFR's persistently     <90 mL/min signify     possible Chronic Kidney Disease.  LACTIC ACID, PLASMA     Status: None   Collection Time    01/05/13 10:34 PM      Result Value Range   Lactic Acid, Venous 1.0  0.5 - 2.2 mmol/L  COMPREHENSIVE METABOLIC PANEL     Status: Abnormal   Collection Time    01/06/13  4:03 AM      Result Value Range   Sodium 134 (*) 135 - 145 mEq/L   Potassium 4.0  3.5 - 5.1 mEq/L   Chloride 100  96 - 112 mEq/L   CO2 25  19 - 32 mEq/L   Glucose, Bld 124 (*) 70 - 99 mg/dL   BUN 23  6 - 23 mg/dL   Creatinine, Ser 3.66  0.50 - 1.35 mg/dL   Calcium 8.2 (*) 8.4 - 10.5 mg/dL   Total Protein 5.7 (*) 6.0 - 8.3 g/dL   Albumin 2.7 (*) 3.5 - 5.2 g/dL   AST 14  0 - 37 U/L   ALT 13  0 - 53 U/L   Alkaline Phosphatase 43  39 - 117 U/L   Total Bilirubin 0.3  0.3 - 1.2 mg/dL   GFR calc non Af Amer 76 (*) >90 mL/min   GFR calc Af Amer 88 (*) >90 mL/min   Comment:            The eGFR  has been calculated     using the CKD EPI equation.     This calculation has not been     validated in all clinical     situations.     eGFR's persistently     <90 mL/min signify     possible Chronic Kidney Disease.  CBC     Status: Abnormal   Collection Time    01/06/13  4:03 AM      Result Value Range   WBC 1.4 (*) 4.0 - 10.5 K/uL   Comment: CRITICAL RESULT CALLED TO, READ BACK BY AND VERIFIED WITH:     WARD,K.RN @0456  01/06/13 WELLS,D.   RBC 2.18 (*) 4.22 - 5.81 MIL/uL   Hemoglobin 8.5 (*) 13.0 - 17.0 g/dL   HCT 44.0 (*) 34.7 - 42.5 %   MCV 109.6 (*) 78.0 - 100.0 fL   MCH 39.0 (*) 26.0 -  34.0 pg   MCHC 35.6  30.0 - 36.0 g/dL   RDW 16.1  09.6 - 04.5 %   Platelets 159  150 - 400 K/uL  RETICULOCYTES     Status: Abnormal   Collection Time    01/06/13 10:17 AM      Result Value Range   Retic Ct Pct 1.3  0.4 - 3.1 %   RBC. 2.12 (*) 4.22 - 5.81 MIL/uL   Retic Count, Manual 27.6  19.0 - 186.0 K/uL   Labs pending: iron, IBC, ferritin, B12 and folate I have added haptoglobin, DAT, LDH, hemoccults, ANA   Peripheral blood smear reviewed now:  RBCs no schistocytes or other fragmented forms,  probable spherocytes, no NRBCs seen. WBCs decreased, lymphs, mono, seg seen, no immature cells seen. Platelets abundant, normal size.  Pathologist review of smear pending.  Dg Chest 2 View  01/05/2013  *RADIOLOGY REPORT*  Clinical Data: Fever for 1 day.  Fatigue.  Ex-smoker.  CHEST - 2 VIEW  Comparison: None.  Findings: Hyperinflation suggesting emphysematous change.  There are patchy airspace infiltrates in both lower lungs posteriorly. Changes may represent focal pneumonia.  Normal heart size and pulmonary vascularity.  No blunting of costophrenic angles.  No pneumothorax.  Mediastinal contours appear intact.  Calcification of the aorta.  Mild degenerative changes in the thoracic spine.  IMPRESSION: Hyperinflation suggesting emphysematous change.  Patchy focal areas of infiltration in both lung bases  may represent pneumonia.  Follow up after resolution of acute symptoms is recommend to exclude underlying mass lesion.   Original Report Authenticated By: Burman Nieves, M.D.         Assessment/Plan: 1.anemia and leukopenia/ neutropenia: possibly new in past year from history available. Anemia labs  pending, no overt bleeding, does not seem symptomatic at rest. Neutropenic precautions, covering with broad spectrum antibiotics. He may need bone marrow exam, however I would like to see results of initial tests before ordering that. Platelets ok now. Follow daily CBC diff for now. Previous CBCs back several years would be helpful. 2.post Mohs for early stage melanoma on nose: additional information not available now. 3.BPH 4.history environmental allergies and previous sinusitis 5.question of early pneumonia on CXR, not clinically obvious.   Shawn Goodman,Shawn Goodman 01/06/2013, 3:21 PM

## 2013-01-07 ENCOUNTER — Encounter (HOSPITAL_COMMUNITY): Payer: Self-pay | Admitting: Radiology

## 2013-01-07 DIAGNOSIS — R5081 Fever presenting with conditions classified elsewhere: Secondary | ICD-10-CM

## 2013-01-07 LAB — COMPREHENSIVE METABOLIC PANEL
ALT: 15 U/L (ref 0–53)
CO2: 23 mEq/L (ref 19–32)
Calcium: 8.4 mg/dL (ref 8.4–10.5)
Chloride: 102 mEq/L (ref 96–112)
GFR calc Af Amer: 77 mL/min — ABNORMAL LOW (ref >90)
GFR calc non Af Amer: 66 mL/min — ABNORMAL LOW (ref >90)
Glucose, Bld: 111 mg/dL — ABNORMAL HIGH (ref 70–99)
Sodium: 135 mEq/L (ref 135–145)
Total Bilirubin: 0.3 mg/dL (ref 0.3–1.2)

## 2013-01-07 LAB — CBC WITH DIFFERENTIAL/PLATELET
Basophils Absolute: 0 10*3/uL (ref 0.0–0.1)
Basophils Relative: 0 % (ref 0–1)
Eosinophils Absolute: 0 10*3/uL (ref 0.0–0.7)
Hemoglobin: 8.8 g/dL — ABNORMAL LOW (ref 13.0–17.0)
Lymphocytes Relative: 66 % — ABNORMAL HIGH (ref 12–46)
MCH: 38.9 pg — ABNORMAL HIGH (ref 26.0–34.0)
MCHC: 36.1 g/dL — ABNORMAL HIGH (ref 30.0–36.0)
Monocytes Absolute: 0.1 10*3/uL (ref 0.1–1.0)
Neutrophils Relative %: 25 % — ABNORMAL LOW (ref 43–77)
Platelets: 150 10*3/uL (ref 150–400)
RDW: 14.1 % (ref 11.5–15.5)

## 2013-01-07 LAB — PATHOLOGIST SMEAR REVIEW

## 2013-01-07 LAB — HAPTOGLOBIN: Haptoglobin: 329 mg/dL — ABNORMAL HIGH (ref 45–215)

## 2013-01-07 NOTE — Progress Notes (Signed)
MD, Patient's daughter Augustin Schooling would like to speak with you regarding updates at your convenience.  She can be reached at [phone].  Thanks, Kenton Kingfisher [place]

## 2013-01-07 NOTE — Progress Notes (Signed)
01/07/2013, 10:02 AM  Hospital day: 3 Antibiotics: vancomycin, zosyn   Patient seen in continuing attention to neutropenia and anemia, evaluation still in process.  Subjective: Awake, alert, very pleasant and denies discomfort now. Tells me he had drenching sweat last pm after temp up to 100.9, required changing pajamas and sheets. Denies SOB up to BR, denies cough or lower respiratory symptoms. Some post nasal drainage, no sore throat. Did sleep after the sweating.  Note from RN that daughter would like to speak to MDs, and patient is glad for me to call her.  Objective: Vital signs in last 24 hours: Blood pressure 132/54, pulse 68, temperature 98.3 F (36.8 C), temperature source Oral, resp. rate 20, height 5\' 11"  (1.803 m), weight 158 lb 8.2 oz (71.9 kg), SpO2 98.00%.   Intake/Output from previous day: 03/16 0701 - 03/17 0700 In: 1791.3 [Goodman.O.:720; I.V.:821.3; IV Piggyback:250] Out: 2500 [Urine:2500] Intake/Output this shift:    Physical exam: Appears comfortable in bed on RA. PERRL, speech fluent, oral mucosa moist and clear. Lungs clear. Heart RRR, no gallop. Abdomen soft, nontender, some BS, no HSM clear. LE no edema, cords, tenderness.  Lab Results:  Recent Labs  01/06/13 0403 01/07/13 0355  WBC 1.4* 1.5*  HGB 8.5* 8.8*  HCT 23.9* 24.4*  PLT 159 150  ANC 0.4,MCV 108, polychromasia and atypical lymphs called.  BMET  Recent Labs  01/06/13 0403 01/07/13 0355  NA 134* 135  K 4.0 4.2  CL 100 102  CO2 25 23  GLUCOSE 124* 111*  BUN 23 17  CREATININE 0.94 1.02  CALCIUM 8.2* 8.4  Remainder of full CMET: T prot 5.6, alb 2.6, T bili 0.3 and other LFTs normal LDH normal at 160 Urine culture ordered with temp elevation last pm Iron/IBC 56 and 181,  Ferritin 386, B12 409, folate 19 Retics 1.3% ANA, DAT pending  Blood cultures x2 from 01-05-13 negative to date   Studies/Results: Dg Chest 2 View  01/05/2013  *RADIOLOGY REPORT*  Clinical Data: Fever for 1 day.   Fatigue.  Ex-smoker.  CHEST - 2 VIEW  Comparison: None.  Findings: Hyperinflation suggesting emphysematous change.  There are patchy airspace infiltrates in both lower lungs posteriorly. Changes may represent focal pneumonia.  Normal heart size and pulmonary vascularity.  No blunting of costophrenic angles.  No pneumothorax.  Mediastinal contours appear intact.  Calcification of the aorta.  Mild degenerative changes in the thoracic spine.  IMPRESSION: Hyperinflation suggesting emphysematous change.  Patchy focal areas of infiltration in both lung bases may represent pneumonia.  Follow up after resolution of acute symptoms is recommend to exclude underlying mass lesion.   Original Report Authenticated By: Burman Nieves, M.D.      Assessment/Plan: 1.anemia and leukopenia/ neutropenia: previous CBCs from Dr Lanell Matar records will be helpful. Counts fairly stable today, temp to 100.9 last pm, still on broad spectrum antibiotics. I believe bone marrow exam will be appropriate and will order that now, prior to all of other blood work being resulted, as it may take a couple of days to get that procedure done.  2.post Mohs for early stage melanoma on nose: additional information not available now.  3.BPH. Urine culture pending 4.history environmental allergies and previous sinusitis, tho no symptoms suggesting acute sinusitis now. 5.question of early pneumonia on CXR, not clinically obvious.   I spoke by phone now with daughter Augustin Schooling [phone]. We have discussed the blood counts, work up in process, bone marrow ordered, reason for antibiotics and need for him  to be in hospital now with neutropenic fever.  She appreciated call.  Shawn Goodman,Shawn Goodman [phone]

## 2013-01-07 NOTE — Progress Notes (Signed)
Medical Oncology  Spoke with [facility], who will fax CBC back to 2011 to my office.  Ila Mcgill, MD

## 2013-01-07 NOTE — Progress Notes (Signed)
Subjective: Patient was seen this morning and noted that he had some fever and diaphoresis last night, otherwise felt fine without productive cough, nausea, or abdominal pain.  Objective: Vital signs in last 24 hours: Temp:  [98.3 F (36.8 C)-100.9 F (38.3 C)] 98.3 F (36.8 C) (03/17 0527) Pulse Rate:  [68-85] 68 (03/17 0527) Resp:  [20] 20 (03/17 0527) BP: (132-149)/(46-60) 132/54 mmHg (03/17 0527) SpO2:  [96 %-98 %] 98 % (03/17 0527) Weight change:    Intake/Output from previous day: 03/16 0701 - 03/17 0700 In: 1791.3 [P.O.:720; I.V.:821.3; IV Piggyback:250] Out: 2500 [Urine:2500]   General appearance: alert, cooperative and no distress Resp: clear to auscultation bilaterally Cardio: regular rate and rhythm, S1, S2 normal, no murmur, click, rub or gallop GI: soft, non-tender; bowel sounds normal; no masses,  no organomegaly Extremities: extremities normal, atraumatic, no cyanosis or edema  Lab Results:  Recent Labs  01/06/13 0403 01/07/13 0355  WBC 1.4* 1.5*  HGB 8.5* 8.8*  HCT 23.9* 24.4*  PLT 159 150   BMET  Recent Labs  01/06/13 0403 01/07/13 0355  NA 134* 135  K 4.0 4.2  CL 100 102  CO2 25 23  GLUCOSE 124* 111*  BUN 23 17  CREATININE 0.94 1.02  CALCIUM 8.2* 8.4   CMET CMP     Component Value Date/Time   NA 135 01/07/2013 0355   K 4.2 01/07/2013 0355   CL 102 01/07/2013 0355   CO2 23 01/07/2013 0355   GLUCOSE 111* 01/07/2013 0355   BUN 17 01/07/2013 0355   CREATININE 1.02 01/07/2013 0355   CALCIUM 8.4 01/07/2013 0355   PROT 5.6* 01/07/2013 0355   ALBUMIN 2.6* 01/07/2013 0355   AST 15 01/07/2013 0355   ALT 15 01/07/2013 0355   ALKPHOS 42 01/07/2013 0355   BILITOT 0.3 01/07/2013 0355   GFRNONAA 66* 01/07/2013 0355   GFRAA 77* 01/07/2013 0355    CBG (last 3)  No results found for this basename: GLUCAP,  in the last 72 hours  INR RESULTS:   No results found for this basename: INR, PROTIME     Studies/Results: Dg Chest 2 View  01/05/2013   *RADIOLOGY REPORT*  Clinical Data: Fever for 1 day.  Fatigue.  Ex-smoker.  CHEST - 2 VIEW  Comparison: None.  Findings: Hyperinflation suggesting emphysematous change.  There are patchy airspace infiltrates in both lower lungs posteriorly. Changes may represent focal pneumonia.  Normal heart size and pulmonary vascularity.  No blunting of costophrenic angles.  No pneumothorax.  Mediastinal contours appear intact.  Calcification of the aorta.  Mild degenerative changes in the thoracic spine.  IMPRESSION: Hyperinflation suggesting emphysematous change.  Patchy focal areas of infiltration in both lung bases may represent pneumonia.  Follow up after resolution of acute symptoms is recommend to exclude underlying mass lesion.   Original Report Authenticated By: Burman Nieves, M.D.     Medications: I have reviewed the patient's current medications.  Assessment/Plan: #1  Fever: stable and likely from neutropenia versus early pneumonia. He is stable on broad spectrum antibiotics.  #2 Neutropenia:  Stable and I have amended admission H&P to add in previous labs from our office. I agree with plans for bone marrow biopsy and appreciate assistance from Dr. Darrold Span.   LOS: 2 days   Gabrille Kilbride G 01/07/2013, 1:18 PM

## 2013-01-07 NOTE — H&P (Signed)
HPI: Shawn Goodman is an 79 y.o. male admitted with pneumonia but also found to have profound neutropenia. He is being worked up and IR is requested to perform BM biopsy to aid in diagnosis. Pt chart, PMHx , and meds reviewed. He feels well and is in good spirits.  Past Medical History: History reviewed. No pertinent past medical history.  Past Surgical History:  Past Surgical History  Procedure Laterality Date  . Cataract extraction w/ intraocular lens  implant, bilateral    . Inguinal hernia repair  1953  . 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 Tonsillectomy and adenoidectomy  1939  . Melanoma removal      Family History:  Family History  Problem Relation Age of Onset  . Heart disease Father     Social History:  reports that he has quit smoking. His smoking use included Pipe and Cigars. He has never used smokeless tobacco. He reports that he does not drink alcohol or use illicit drugs.  Allergies: No Known Allergies  Medications: Medications Prior to Admission  Medication Sig Dispense Refill  . Multiple Vitamins-Minerals (MULTI FOR HIM 50+ PO) Take 1 tablet by mouth daily. [employer] pack/day        Please HPI for pertinent positives, otherwise complete 10 system ROS negative.  Physical Exam: Blood pressure 132/54, pulse 68, temperature 98.3 F (36.8 C), temperature source Oral, resp. rate 20, height 5\' 11"  (1.803 m), weight 158 lb 8.2 oz (71.9 kg), SpO2 98.00%. Body mass index is 22.12 kg/(m^2).   General Appearance:  Alert, cooperative, no distress, appears stated age  Head:  Normocephalic, without obvious abnormality, atraumatic  ENT: Unremarkable  Neck: Supple, symmetrical, trachea midline, no adenopathy, thyroid: not enlarged, symmetric, no tenderness/mass/nodules  Lungs:   Clear to auscultation bilaterally, no w/r/r, respirations unlabored without use of accessory muscles.  Chest Wall:  No tenderness or deformity  Heart:  Regular rate and rhythm, S1, S2 normal,  no murmur, rub or gallop. Carotids 2+ without bruit.  Neurologic: Normal affect, no gross deficits.   Results for orders placed during the hospital encounter of 01/05/13 (from the past 48 hour(s))  URINALYSIS, ROUTINE W REFLEX MICROSCOPIC     Status: Abnormal   Collection Time    01/05/13  8:26 PM      Result Value Range   Color, Urine AMBER (*) YELLOW   Comment: BIOCHEMICALS MAY BE AFFECTED BY COLOR   APPearance CLEAR  CLEAR   Specific Gravity, Urine 1.027  1.005 - 1.030   pH 5.5  5.0 - 8.0   Glucose, UA NEGATIVE  NEGATIVE mg/dL   Hgb urine dipstick NEGATIVE  NEGATIVE   Bilirubin Urine NEGATIVE  NEGATIVE   Ketones, ur NEGATIVE  NEGATIVE mg/dL   Protein, ur 30 (*) NEGATIVE mg/dL   Urobilinogen, UA 1.0  0.0 - 1.0 mg/dL   Nitrite NEGATIVE  NEGATIVE   Leukocytes, UA NEGATIVE  NEGATIVE  URINE MICROSCOPIC-ADD ON     Status: Abnormal   Collection Time    01/05/13  8:26 PM      Result Value Range   Squamous Epithelial / LPF RARE  RARE   WBC, UA 0-2  <3 WBC/hpf   Bacteria, UA MANY (*) RARE   Urine-Other MUCOUS PRESENT    CBC WITH DIFFERENTIAL     Status: Abnormal   Collection Time    01/05/13  9:12 PM      Result Value Range   WBC 1.5 (*) 4.0 - 10.5  K/uL   Comment: REPEATED TO VERIFY   RBC 2.32 (*) 4.22 - 5.81 MIL/uL   Hemoglobin 9.2 (*) 13.0 - 17.0 g/dL   HCT 16.1 (*) 09.6 - 04.5 %   MCV 113.8 (*) 78.0 - 100.0 fL   MCH 39.7 (*) 26.0 - 34.0 pg   MCHC 34.8  30.0 - 36.0 g/dL   RDW 40.9  81.1 - 91.4 %   Platelets 173  150 - 400 K/uL   Neutrophils Relative 24 (*) 43 - 77 %   Lymphocytes Relative 67 (*) 12 - 46 %   Monocytes Relative 5  3 - 12 %   Eosinophils Relative 3  0 - 5 %   Basophils Relative 1  0 - 1 %   Neutro Abs 0.4 (*) 1.7 - 7.7 K/uL   Lymphs Abs 1.0  0.7 - 4.0 K/uL   Monocytes Absolute 0.1  0.1 - 1.0 K/uL   Eosinophils Absolute 0.0  0.0 - 0.7 K/uL   Basophils Absolute 0.0  0.0 - 0.1 K/uL   RBC Morphology POLYCHROMASIA PRESENT     Comment: SPHEROCYTES   WBC  Morphology ATYPICAL LYMPHOCYTES     Comment: WHITE COUNT CONFIRMED ON SMEAR   Smear Review PLATELET COUNT CONFIRMED BY SMEAR     Comment: PENDING PATHOLOGIST REVIEW  COMPREHENSIVE METABOLIC PANEL     Status: Abnormal   Collection Time    01/05/13  9:12 PM      Result Value Range   Sodium 136  135 - 145 mEq/L   Potassium 4.2  3.5 - 5.1 mEq/L   Chloride 100  96 - 112 mEq/L   CO2 24  19 - 32 mEq/L   Glucose, Bld 191 (*) 70 - 99 mg/dL   BUN 30 (*) 6 - 23 mg/dL   Creatinine, Ser 7.82  0.50 - 1.35 mg/dL   Calcium 9.1  8.4 - 95.6 mg/dL   Total Protein 6.4  6.0 - 8.3 g/dL   Albumin 3.3 (*) 3.5 - 5.2 g/dL   AST 16  0 - 37 U/L   ALT 14  0 - 53 U/L   Alkaline Phosphatase 60  39 - 117 U/L   Total Bilirubin 0.3  0.3 - 1.2 mg/dL   GFR calc non Af Amer 61 (*) >90 mL/min   GFR calc Af Amer 70 (*) >90 mL/min   Comment:            The eGFR has been calculated     using the CKD EPI equation.     This calculation has not been     validated in all clinical     situations.     eGFR's persistently     <90 mL/min signify     possible Chronic Kidney Disease.  PATHOLOGIST SMEAR REVIEW     Status: None   Collection Time    01/05/13  9:12 PM      Result Value Range   Path Review SEVERE ABSOLUTE NEUTROPENIA WITH TOXIC     Comment: GRANULATION. MILD ANEMIA WITH ELEVATED     MCV BUT RBC's DO NOT APPEAR MACROCYTIC (NO     POLYCHROMASIA). NO HYPERSEGMENTATION. DEPENDING     ON CLINICAL MIGHT CONSIDER B12 AND FOLATE     LEVELS.     Reviewed by Coralyn Pear, M.D.     01/07/13  LACTIC ACID, PLASMA     Status: None   Collection Time    01/05/13 10:34 PM  Result Value Range   Lactic Acid, Venous 1.0  0.5 - 2.2 mmol/L  CULTURE, BLOOD (ROUTINE X 2)     Status: None   Collection Time    01/05/13 11:00 PM      Result Value Range   Specimen Description BLOOD LEFT ARM     Special Requests BOTTLES DRAWN AEROBIC AND ANAEROBIC 5CC EACH     Culture  Setup Time 01/06/2013 15:02     Culture       Value:         BLOOD CULTURE RECEIVED NO GROWTH TO DATE CULTURE WILL BE HELD FOR 5 DAYS BEFORE ISSUING A FINAL NEGATIVE REPORT   Report Status PENDING    CULTURE, BLOOD (ROUTINE X 2)     Status: None   Collection Time    01/05/13 11:00 PM      Result Value Range   Specimen Description BLOOD RIGHT HAND     Special Requests BOTTLES DRAWN AEROBIC AND ANAEROBIC 4CC EACH     Culture  Setup Time 01/06/2013 15:02     Culture       Value:        BLOOD CULTURE RECEIVED NO GROWTH TO DATE CULTURE WILL BE HELD FOR 5 DAYS BEFORE ISSUING A FINAL NEGATIVE REPORT   Report Status PENDING    COMPREHENSIVE METABOLIC PANEL     Status: Abnormal   Collection Time    01/06/13  4:03 AM      Result Value Range   Sodium 134 (*) 135 - 145 mEq/L   Potassium 4.0  3.5 - 5.1 mEq/L   Chloride 100  96 - 112 mEq/L   CO2 25  19 - 32 mEq/L   Glucose, Bld 124 (*) 70 - 99 mg/dL   BUN 23  6 - 23 mg/dL   Creatinine, Ser 1.61  0.50 - 1.35 mg/dL   Calcium 8.2 (*) 8.4 - 10.5 mg/dL   Total Protein 5.7 (*) 6.0 - 8.3 g/dL   Albumin 2.7 (*) 3.5 - 5.2 g/dL   AST 14  0 - 37 U/L   ALT 13  0 - 53 U/L   Alkaline Phosphatase 43  39 - 117 U/L   Total Bilirubin 0.3  0.3 - 1.2 mg/dL   GFR calc non Af Amer 76 (*) >90 mL/min   GFR calc Af Amer 88 (*) >90 mL/min   Comment:            The eGFR has been calculated     using the CKD EPI equation.     This calculation has not been     validated in all clinical     situations.     eGFR's persistently     <90 mL/min signify     possible Chronic Kidney Disease.  CBC     Status: Abnormal   Collection Time    01/06/13  4:03 AM      Result Value Range   WBC 1.4 (*) 4.0 - 10.5 K/uL   Comment: CRITICAL RESULT CALLED TO, READ BACK BY AND VERIFIED WITH:     WARD,K.RN @0456  01/06/13 WELLS,D.   RBC 2.18 (*) 4.22 - 5.81 MIL/uL   Hemoglobin 8.5 (*) 13.0 - 17.0 g/dL   HCT 09.6 (*) 04.5 - 40.9 %   MCV 109.6 (*) 78.0 - 100.0 fL   MCH 39.0 (*) 26.0 - 34.0 pg   MCHC 35.6  30.0 - 36.0 g/dL   RDW 81.1   91.4 - 78.2 %  Platelets 159  150 - 400 K/uL  LACTATE DEHYDROGENASE     Status: None   Collection Time    01/06/13 10:15 AM      Result Value Range   LDH 180  94 - 250 U/L  VITAMIN B12     Status: None   Collection Time    01/06/13 10:16 AM      Result Value Range   Vitamin B-12 409  211 - 911 pg/mL  FOLATE     Status: None   Collection Time    01/06/13 10:17 AM      Result Value Range   Folate 19.6     Comment: (NOTE)     Reference Ranges            Deficient:       0.4 - 3.3 ng/mL            Indeterminate:   3.4 - 5.4 ng/mL            Normal:              > 5.4 ng/mL  IRON AND TIBC     Status: Abnormal   Collection Time    01/06/13 10:17 AM      Result Value Range   Iron 56  42 - 135 ug/dL   TIBC 161 (*) 096 - 045 ug/dL   Saturation Ratios 31  20 - 55 %   UIBC 125  125 - 400 ug/dL  FERRITIN     Status: Abnormal   Collection Time    01/06/13 10:17 AM      Result Value Range   Ferritin 386 (*) 22 - 322 ng/mL  RETICULOCYTES     Status: Abnormal   Collection Time    01/06/13 10:17 AM      Result Value Range   Retic Ct Pct 1.3  0.4 - 3.1 %   RBC. 2.12 (*) 4.22 - 5.81 MIL/uL   Retic Count, Manual 27.6  19.0 - 186.0 K/uL  DIRECT ANTIGLOBULIN TEST     Status: None   Collection Time    01/06/13  4:30 PM      Result Value Range   DAT, complement NEG     DAT, IgG NEG    LACTATE DEHYDROGENASE     Status: None   Collection Time    01/07/13  3:55 AM      Result Value Range   LDH 160  94 - 250 U/L  COMPREHENSIVE METABOLIC PANEL     Status: Abnormal   Collection Time    01/07/13  3:55 AM      Result Value Range   Sodium 135  135 - 145 mEq/L   Potassium 4.2  3.5 - 5.1 mEq/L   Chloride 102  96 - 112 mEq/L   CO2 23  19 - 32 mEq/L   Glucose, Bld 111 (*) 70 - 99 mg/dL   BUN 17  6 - 23 mg/dL   Creatinine, Ser 4.09  0.50 - 1.35 mg/dL   Calcium 8.4  8.4 - 81.1 mg/dL   Total Protein 5.6 (*) 6.0 - 8.3 g/dL   Albumin 2.6 (*) 3.5 - 5.2 g/dL   AST 15  0 - 37 U/L   ALT 15  0  - 53 U/L   Alkaline Phosphatase 42  39 - 117 U/L   Total Bilirubin 0.3  0.3 - 1.2 mg/dL   GFR calc non Af Amer 66 (*) >90 mL/min  GFR calc Af Amer 77 (*) >90 mL/min   Comment:            The eGFR has been calculated     using the CKD EPI equation.     This calculation has not been     validated in all clinical     situations.     eGFR's persistently     <90 mL/min signify     possible Chronic Kidney Disease.  CBC WITH DIFFERENTIAL     Status: Abnormal   Collection Time    01/07/13  3:55 AM      Result Value Range   WBC 1.5 (*) 4.0 - 10.5 K/uL   RBC 2.26 (*) 4.22 - 5.81 MIL/uL   Hemoglobin 8.8 (*) 13.0 - 17.0 g/dL   HCT 16.1 (*) 09.6 - 04.5 %   MCV 108.0 (*) 78.0 - 100.0 fL   MCH 38.9 (*) 26.0 - 34.0 pg   MCHC 36.1 (*) 30.0 - 36.0 g/dL   RDW 40.9  81.1 - 91.4 %   Platelets 150  150 - 400 K/uL   Neutrophils Relative 25 (*) 43 - 77 %   Lymphocytes Relative 66 (*) 12 - 46 %   Monocytes Relative 7  3 - 12 %   Eosinophils Relative 2  0 - 5 %   Basophils Relative 0  0 - 1 %   Neutro Abs 0.4 (*) 1.7 - 7.7 K/uL   Lymphs Abs 1.0  0.7 - 4.0 K/uL   Monocytes Absolute 0.1  0.1 - 1.0 K/uL   Eosinophils Absolute 0.0  0.0 - 0.7 K/uL   Basophils Absolute 0.0  0.0 - 0.1 K/uL   RBC Morphology POLYCHROMASIA PRESENT     Comment: TEARDROP CELLS   WBC Morphology ATYPICAL LYMPHOCYTES    PATHOLOGIST SMEAR REVIEW     Status: None   Collection Time    01/07/13  3:55 AM      Result Value Range   Path Review Reviewed by Beulah Gandy. Luisa Hart, M.D.     Comment: MACROCYTIC ANEMIA     LEUKOPENIA WITH ATYPICAL IMMATURE LYMPHOCYTES.     HEMATOLOGIC EVALUATION RECOMMENDED.   Dg Chest 2 View  01/05/2013  *RADIOLOGY REPORT*  Clinical Data: Fever for 1 day.  Fatigue.  Ex-smoker.  CHEST - 2 VIEW  Comparison: None.  Findings: Hyperinflation suggesting emphysematous change.  There are patchy airspace infiltrates in both lower lungs posteriorly. Changes may represent focal pneumonia.  Normal heart size and  pulmonary vascularity.  No blunting of costophrenic angles.  No pneumothorax.  Mediastinal contours appear intact.  Calcification of the aorta.  Mild degenerative changes in the thoracic spine.  IMPRESSION: Hyperinflation suggesting emphysematous change.  Patchy focal areas of infiltration in both lung bases may represent pneumonia.  Follow up after resolution of acute symptoms is recommend to exclude underlying mass lesion.   Original Report Authenticated By: Burman Nieves, M.D.     Assessment/Plan Neutropenia, anemia For CT BM biopsy tomorrow ~0900. Discussed procedure, risks, complications. Labs reviewed, ok. Consent signed in chart  Brayton El PA-C 01/07/2013, 1:30 PM

## 2013-01-08 ENCOUNTER — Inpatient Hospital Stay (HOSPITAL_COMMUNITY): Payer: Medicare Other

## 2013-01-08 DIAGNOSIS — J189 Pneumonia, unspecified organism: Secondary | ICD-10-CM

## 2013-01-08 LAB — CBC WITH DIFFERENTIAL/PLATELET
Basophils Absolute: 0 10*3/uL (ref 0.0–0.1)
Eosinophils Relative: 3 % (ref 0–5)
HCT: 24.6 % — ABNORMAL LOW (ref 39.0–52.0)
Hemoglobin: 8.9 g/dL — ABNORMAL LOW (ref 13.0–17.0)
Lymphocytes Relative: 67 % — ABNORMAL HIGH (ref 12–46)
Lymphs Abs: 1.2 10*3/uL (ref 0.7–4.0)
MCV: 108.4 fL — ABNORMAL HIGH (ref 78.0–100.0)
Monocytes Relative: 6 % (ref 3–12)
Neutro Abs: 0.4 10*3/uL — ABNORMAL LOW (ref 1.7–7.7)
RBC: 2.27 MIL/uL — ABNORMAL LOW (ref 4.22–5.81)
RDW: 13.8 % (ref 11.5–15.5)
WBC: 1.8 10*3/uL — ABNORMAL LOW (ref 4.0–10.5)

## 2013-01-08 LAB — URINE CULTURE

## 2013-01-08 MED ORDER — FENTANYL CITRATE 0.05 MG/ML IJ SOLN
INTRAMUSCULAR | Status: AC | PRN
  Administered 2013-01-08: 50 ug via INTRAVENOUS
  Administered 2013-01-08: 25 ug via INTRAVENOUS

## 2013-01-08 MED ORDER — MIDAZOLAM HCL 2 MG/2ML IJ SOLN
INTRAMUSCULAR | Status: AC | PRN
  Administered 2013-01-08: 0.5 mg via INTRAVENOUS
  Administered 2013-01-08: 1 mg via INTRAVENOUS

## 2013-01-08 NOTE — Progress Notes (Signed)
Subjective: Feeling sleepy after sedation with Ct guided bone marrow biopsy earlier today. He is afebrile and without dyspnea or abdominal pain.   Objective: Vital signs in last 24 hours: Temp:  [98.3 F (36.8 C)-99.5 F (37.5 C)] 98.3 F (36.8 C) (03/18 1100) Pulse Rate:  [65-85] 66 (03/18 1100) Resp:  [14-20] 16 (03/18 1100) BP: (126-153)/(50-69) 135/56 mmHg (03/18 1100) SpO2:  [93 %-99 %] 97 % (03/18 1100) Weight change:    Intake/Output from previous day: 03/17 0701 - 03/18 0700 In: 2747.5 [P.O.:360; I.V.:1837.5; IV Piggyback:550] Out: 1500 [Urine:1500]   General appearance: alert, cooperative and no distress Resp: clear to auscultation bilaterally Cardio: regular rate and rhythm, S1, S2 normal, no murmur, click, rub or gallop GI: soft, non-tender; bowel sounds normal; no masses,  no organomegaly Extremities: extremities normal, atraumatic, no cyanosis or edema  Lab Results:  Recent Labs  01/07/13 0355 01/08/13 0352  WBC 1.5* 1.8*  HGB 8.8* 8.9*  HCT 24.4* 24.6*  PLT 150 177   BMET  Recent Labs  01/06/13 0403 01/07/13 0355  NA 134* 135  K 4.0 4.2  CL 100 102  CO2 25 23  GLUCOSE 124* 111*  BUN 23 17  CREATININE 0.94 1.02  CALCIUM 8.2* 8.4   CMET CMP     Component Value Date/Time   NA 135 01/07/2013 0355   K 4.2 01/07/2013 0355   CL 102 01/07/2013 0355   CO2 23 01/07/2013 0355   GLUCOSE 111* 01/07/2013 0355   BUN 17 01/07/2013 0355   CREATININE 1.02 01/07/2013 0355   CALCIUM 8.4 01/07/2013 0355   PROT 5.6* 01/07/2013 0355   ALBUMIN 2.6* 01/07/2013 0355   AST 15 01/07/2013 0355   ALT 15 01/07/2013 0355   ALKPHOS 42 01/07/2013 0355   BILITOT 0.3 01/07/2013 0355   GFRNONAA 66* 01/07/2013 0355   GFRAA Shawn* 01/07/2013 0355    CBG (last 3)  No results found for this basename: GLUCAP,  in the last 72 hours  INR RESULTS:   No results found for this basename: INR, PROTIME     Studies/Results: Ct Biopsy  01/08/2013  *RADIOLOGY REPORT*  CT GUIDED RIGHT  ILIAC BONE MARROW ASPIRATION AND BONE MARROW CORE BIOPSIES  Date:  Clinical History: Shawn Goodman with neutropenia and anemia. Bone marrow biopsy is requested.  Procedures Performed:  1. CT guided bone marrow aspiration and core biopsy  Interventional Radiologist:  Sterling Big, MD  Sedation: Moderate (conscious) sedation was used. Two mg Versed, 100 mcg Fentanyl were administered intravenously.  The patient's vital signs were monitored continuously by radiology nursing throughout the procedure.  Sedation Time: 10 minutes  PROCEDURE/FINDINGS:  Informed consent was obtained from the patient following explanation of the procedure, risks, benefits and alternatives. The patient understands, agrees and consents for the procedure. All questions were addressed.  A time out was performed.  The patient was positioned prone and noncontrast localization CT was performed of the pelvis to demonstrate the iliac marrow spaces.  Maximal barrier sterile technique utilized including caps, mask, sterile gowns, sterile gloves, large sterile drape, hand hygiene, and betadine prep.  Under sterile conditions and local anesthesia, an 11 gauge coaxial bone biopsy needle was advanced into the right iliac marrow space. Needle position was confirmed with CT imaging. Initially, bone marrow aspiration was performed. Next, the 11 gauge outer cannula was utilized to obtain a right iliac bone marrow core biopsy. Needle was removed. Hemostasis was obtained with compression. The patient tolerated the procedure well.  Samples were prepared with the cytotechnologist. No immediate complications.  IMPRESSION:  CT guided right iliac bone marrow aspiration and core biopsy.  Signed,  Sterling Big, MD Vascular & Interventional Radiologist [facility]   Original Report Authenticated By: Malachy Moan, M.D.     Medications: I have reviewed the patient's current medications.  Assessment/Plan: #1 Fever with Neutropenia: stable  on broad spectrum antibiotics. Bone marrow biopsy results are pending. If he stays stable we may be able to discharge him on po antibiotics in the near future.    LOS: 3 days   Kaiyan Luczak G 01/08/2013, 12:14 PM

## 2013-01-08 NOTE — Procedures (Signed)
Interventional Radiology Procedure Note  Procedure: CT guided aspirate and core biopsy of right iliac bone Complications: None Recommendations: - Bedrest supine x 2 hrs - Hydrocodone PRN  Pain - Follow biopsy results  Signed,  Bryton Waight K. Brek Reece, MD Vascular & Interventional Radiologist [facility]  

## 2013-01-08 NOTE — Progress Notes (Signed)
ANTIBIOTIC CONSULT NOTE - FOLLOW UP  Pharmacy Consult for Vancomycin Indication: r/o PNA, fever, unexplained neutropenia  No Known Allergies  Patient Measurements: Height: 5\' 11"  (180.3 cm) Weight: 158 lb 8.2 oz (71.9 kg) IBW/kg (Calculated) : 75.3  Vital Signs: Temp: 98.3 F (36.8 C) (03/18 1100) Temp src: Oral (03/18 1100) BP: 135/56 mmHg (03/18 1100) Pulse Rate: 66 (03/18 1100) Intake/Output from previous day: 03/17 0701 - 03/18 0700 In: 2747.5 [P.O.:360; I.V.:1837.5; IV Piggyback:550] Out: 1500 [Urine:1500] Intake/Output from this shift: Total I/O In: 240 [P.O.:240] Out: 400 [Urine:400]  Labs:  Recent Labs  01/05/13 2112 01/06/13 0403 01/07/13 0355 01/08/13 0352  WBC 1.5* 1.4* 1.5* 1.8*  HGB 9.2* 8.5* 8.8* 8.9*  PLT 173 159 150 177  CREATININE 1.10 0.94 1.02  --    Estimated Creatinine Clearance: 56.8 ml/min (by C-G formula based on Cr of 1.02). No results found for this basename: VANCOTROUGH, Leodis Binet, VANCORANDOM, GENTTROUGH, GENTPEAK, GENTRANDOM, TOBRATROUGH, TOBRAPEAK, TOBRARND, AMIKACINPEAK, AMIKACINTROU, AMIKACIN,  in the last 72 hours   Microbiology: Recent Results (from the past 720 hour(s))  CULTURE, BLOOD (ROUTINE X 2)     Status: None   Collection Time    01/05/13 11:00 PM      Result Value Range Status   Specimen Description BLOOD LEFT ARM   Final   Special Requests BOTTLES DRAWN AEROBIC AND ANAEROBIC 5CC EACH   Final   Culture  Setup Time 01/06/2013 15:02   Final   Culture     Final   Value:        BLOOD CULTURE RECEIVED NO GROWTH TO DATE CULTURE WILL BE HELD FOR 5 DAYS BEFORE ISSUING A FINAL NEGATIVE REPORT   Report Status PENDING   Incomplete  CULTURE, BLOOD (ROUTINE X 2)     Status: None   Collection Time    01/05/13 11:00 PM      Result Value Range Status   Specimen Description BLOOD RIGHT HAND   Final   Special Requests BOTTLES DRAWN AEROBIC AND ANAEROBIC 4CC EACH   Final   Culture  Setup Time 01/06/2013 15:02   Final   Culture      Final   Value:        BLOOD CULTURE RECEIVED NO GROWTH TO DATE CULTURE WILL BE HELD FOR 5 DAYS BEFORE ISSUING A FINAL NEGATIVE REPORT   Report Status PENDING   Incomplete    Anti-infectives   Start     Dose/Rate Route Frequency Ordered Stop   01/06/13 1000  piperacillin-tazobactam (ZOSYN) IVPB 3.375 g     3.375 g 12.5 mL/hr over 240 Minutes Intravenous Every 8 hours 01/06/13 0934     01/06/13 1000  vancomycin (VANCOCIN) IVPB 1000 mg/200 mL premix     1,000 mg 200 mL/hr over 60 Minutes Intravenous Every 12 hours 01/06/13 0945     01/06/13 0200  cefTRIAXone (ROCEPHIN) 1 g in dextrose 5 % 50 mL IVPB     1 g 100 mL/hr over 30 Minutes Intravenous  Once 01/06/13 0146 01/06/13 0244   01/05/13 2245  cefTRIAXone (ROCEPHIN) 1 g in dextrose 5 % 50 mL IVPB  Status:  Discontinued     1 g 100 mL/hr over 30 Minutes Intravenous Every 24 hours 01/05/13 2233 01/06/13 0146   01/05/13 2245  vancomycin (VANCOCIN) IVPB 1000 mg/200 mL premix     1,000 mg 200 mL/hr over 60 Minutes Intravenous  Once 01/05/13 2243 01/06/13 0045   01/05/13 2245  piperacillin-tazobactam (ZOSYN) IVPB 3.375 g  3.375 g 12.5 mL/hr over 240 Minutes Intravenous  Once 01/05/13 2243 01/06/13 0116     Assessment: 84 yoM presented 3/15 at [facility] with fever, fatigue. Labs last month per PCP: leukopenia, with predominance of lymphocytes. Begin Vancomycin, Zosyn at [facility] 3/16 for  suspected PNA, r/o UTI. 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  Goal of Therapy:  Vancomycin trough level 15-20 mcg/ml  Plan:  No change Vancomycin 1gm q12 Obtain Vanc trough tomorrow if continued No change Cleone Slim PharmD Pager  [phone] 01/08/2013, 11:21 AM

## 2013-01-08 NOTE — Progress Notes (Signed)
01/08/2013, 12:42 PM  Hospital day: 4 Antibiotics: vancomycin, zosyn  Patient seen now, as he was off unit for bone marrow exam when I came by earlier today. Tolerated that procedure without difficulty, understands no information will be available before tomorrow.   Subjective: Comfortable in bed, eating regular lunch. Still post nasal drainage but no increased pressure there. No lower respiratory symptoms. No pain from bone marrow site, and no bleeding per RN now. Voiding frequently including all thru night, with IVF at 75 cc/hr now; is able to drink fluids well so I have decreased rate to 50/hr. No other localizing symptoms of infection.  Objective: Vital signs in last 24 hours: Blood pressure 135/56, pulse 66, temperature 98.3 F (36.8 C), temperature source Oral, resp. rate 16, height 5\' 11"  (1.803 m), weight 158 lb 8.2 oz (71.9 kg), SpO2 97.00%.   Intake/Output from previous day: 03/17 0701 - 03/18 0700 In: 2747.5 [P.O.:360; I.V.:1837.5; IV Piggyback:550] Out: 1500 [Urine:1500] Intake/Output this shift: Total I/O In: 240 [P.O.:240] Out: 600 [Urine:600]  Physical exam: alert, looks comfortable, respirations not labored RA. Oral mucosa moist and clear. PERRL, not icteric. Skin graft on nose looks better than when I saw him initially, healing. Lungs without wheezes or rales. Heart RRR. Abdomen soft, nontender, + BS. LE no edema, cords, tenderness. Moves all extremities easily. Skin with small scattered ecchymoses UE, no rash  Lab Results:  Recent Labs  01/07/13 0355 01/08/13 0352  WBC 1.5* 1.8*  HGB 8.8* 8.9*  HCT 24.4* 24.6*  PLT 150 177  ANC 0.4, segs 24%, lymphs 67% with atypical lymphs called. BMET  Recent Labs  01/06/13 0403 01/07/13 0355  NA 134* 135  K 4.0 4.2  CL 100 102  CO2 25 23  GLUCOSE 124* 111*  BUN 23 17  CREATININE 0.94 1.02  CALCIUM 8.2* 8.4   Haptoglobin 329, DAT negative Urine culture pending  Studies/Results: Ct Biopsy  01/08/2013   *RADIOLOGY REPORT*  CT GUIDED RIGHT ILIAC BONE MARROW ASPIRATION AND BONE MARROW CORE BIOPSIES  Date:  Clinical History: 79 year old male with neutropenia and anemia. Bone marrow biopsy is requested.  Procedures Performed:  1. CT guided bone marrow aspiration and core biopsy  Interventional Radiologist:  Sterling Big, MD  Sedation: Moderate (conscious) sedation was used. Two mg Versed, 100 mcg Fentanyl were administered intravenously.  The patient's vital signs were monitored continuously by radiology nursing throughout the procedure.  Sedation Time: 10 minutes  PROCEDURE/FINDINGS:  Informed consent was obtained from the patient following explanation of the procedure, risks, benefits and alternatives. The patient understands, agrees and consents for the procedure. All questions were addressed.  A time out was performed.  The patient was positioned prone and noncontrast localization CT was performed of the pelvis to demonstrate the iliac marrow spaces.  Maximal barrier sterile technique utilized including caps, mask, sterile gowns, sterile gloves, large sterile drape, hand hygiene, and betadine prep.  Under sterile conditions and local anesthesia, an 11 gauge coaxial bone biopsy needle was advanced into the right iliac marrow space. Needle position was confirmed with CT imaging. Initially, bone marrow aspiration was performed. Next, the 11 gauge outer cannula was utilized to obtain a right iliac bone marrow core biopsy. Needle was removed. Hemostasis was obtained with compression. The patient tolerated the procedure well. Samples were prepared with the cytotechnologist. No immediate complications.  IMPRESSION:  CT guided right iliac bone marrow aspiration and core biopsy.  Signed,  Sterling Big, MD Vascular & Interventional Radiologist  [facility] Radiology   Original Report Authenticated By: Malachy Moan, M.D.     Sinus Xrays ordered, as this may impact antibiotic course  PATHOLOGY From bone  marrow exam 01-08-13 pending  Assessment/Plan: 1.anemia and leukopenia/ neutropenia: new in past year per information from Dr Lanell Matar office. Bone marrow done this am, results pending. Fever has resolved on broad spectrum antibiotics, which continue. As he is neutropenic, I prefer to continue the IV antibiotics in hospital at least until we get at least preliminary information from the bone marrow, which should be some time tomorrow.  2.post Mohs for early stage melanoma on nose: seems to be healing well 3.BPH. Urine culture pending. IVF rate decreased slightly due to frequent voiding with this. 4.history environmental allergies and previous sinusitis, tho no symptoms suggesting acute sinusitis now.  5.question of early pneumonia on CXR, not clinically obvious. Increased post nasal drainage with history of sinus problems x years, so I have ordered sinus Xrays as this may make a difference with length/ choice of oral antibiotics.    Shawn Goodman P [phone]

## 2013-01-09 LAB — CBC WITH DIFFERENTIAL/PLATELET
Band Neutrophils: 0 % (ref 0–10)
Basophils Absolute: 0 10*3/uL (ref 0.0–0.1)
Basophils Relative: 0 % (ref 0–1)
Blasts: 0 %
Eosinophils Absolute: 0 10*3/uL (ref 0.0–0.7)
Eosinophils Relative: 2 % (ref 0–5)
HCT: 26.2 % — ABNORMAL LOW (ref 39.0–52.0)
Hemoglobin: 9.6 g/dL — ABNORMAL LOW (ref 13.0–17.0)
Lymphocytes Relative: 69 % — ABNORMAL HIGH (ref 12–46)
Lymphs Abs: 1.7 10*3/uL (ref 0.7–4.0)
Monocytes Absolute: 0.1 10*3/uL (ref 0.1–1.0)
Monocytes Relative: 4 % (ref 3–12)
RBC: 2.41 MIL/uL — ABNORMAL LOW (ref 4.22–5.81)
WBC: 2.4 10*3/uL — ABNORMAL LOW (ref 4.0–10.5)

## 2013-01-09 MED ORDER — FILGRASTIM 300 MCG/ML IJ SOLN
300.0000 ug | Freq: Every day | INTRAMUSCULAR | Status: DC
  Administered 2013-01-09: 300 ug via SUBCUTANEOUS
  Filled 2013-01-09 (×2): qty 1

## 2013-01-09 MED ORDER — POTASSIUM CHLORIDE IN NACL 20-0.9 MEQ/L-% IV SOLN
INTRAVENOUS | Status: DC
  Administered 2013-01-09: 500 mL via INTRAVENOUS
  Administered 2013-01-10: 08:00:00 via INTRAVENOUS
  Filled 2013-01-09 (×2): qty 1000

## 2013-01-09 NOTE — Progress Notes (Signed)
Subjective: Feels reasonable, no dyspnea, productive cough, or abdominal pain. Seen earlier this morning.  Objective: Vital signs in last 24 hours: Temp:  [98.2 F (36.8 C)-99 F (37.2 C)] 98.2 F (36.8 C) (03/19 1341) Pulse Rate:  [77-84] 77 (03/19 1341) Resp:  [16-18] 18 (03/19 1341) BP: (131-138)/(51-63) 131/51 mmHg (03/19 1341) SpO2:  [97 %-100 %] 97 % (03/19 1341) Weight change:    Intake/Output from previous day: 03/18 0701 - 03/19 0700 In: 1769.6 [P.O.:360; I.V.:1409.6] Out: 1850 [Urine:1850]   General appearance: alert, cooperative and no distress Resp: clear to auscultation bilaterally Cardio: regular rate and rhythm GI: soft, non-tender; bowel sounds normal; no masses,  no organomegaly Extremities: extremities normal, atraumatic, no cyanosis or edema  Lab Results:  Recent Labs  01/08/13 0352 01/09/13 0345  WBC 1.8* 2.4*  HGB 8.9* 9.6*  HCT 24.6* 26.2*  PLT 177 PLATELET CLUMPS NOTED ON SMEAR, COUNT APPEARS ADEQUATE   BMET  Recent Labs  01/07/13 0355  NA 135  K 4.2  CL 102  CO2 23  GLUCOSE 111*  BUN 17  CREATININE 1.02  CALCIUM 8.4   CMET CMP     Component Value Date/Time   NA 135 01/07/2013 0355   K 4.2 01/07/2013 0355   CL 102 01/07/2013 0355   CO2 23 01/07/2013 0355   GLUCOSE 111* 01/07/2013 0355   BUN 17 01/07/2013 0355   CREATININE 1.02 01/07/2013 0355   CALCIUM 8.4 01/07/2013 0355   PROT 5.6* 01/07/2013 0355   ALBUMIN 2.6* 01/07/2013 0355   AST 15 01/07/2013 0355   ALT 15 01/07/2013 0355   ALKPHOS 42 01/07/2013 0355   BILITOT 0.3 01/07/2013 0355   GFRNONAA 66* 01/07/2013 0355   GFRAA 77* 01/07/2013 0355    CBG (last 3)  No results found for this basename: GLUCAP,  in the last 72 hours  INR RESULTS:   No results found for this basename: INR, PROTIME     Studies/Results: Dg Sinuses Complete  01/08/2013  *RADIOLOGY REPORT*  Clinical Data: Sinus drainage.  Pain forehead and left-side of head  PARANASAL SINUSES - COMPLETE 3 + VIEW   Comparison: None.  Findings: Mucosal thickening/partial opacification maxillary sinuses more notable on the left.  Remainder of the paranasal sinuses appear clear by plain film examination.  IMPRESSION: Mucosal thickening/partial opacification maxillary sinuses more notable on the left.   Original Report Authenticated By: Lacy Duverney, M.D.    Ct Biopsy  01/08/2013  *RADIOLOGY REPORT*  CT GUIDED RIGHT ILIAC BONE MARROW ASPIRATION AND BONE MARROW CORE BIOPSIES  Date:  Clinical History: 79 year old male with neutropenia and anemia. Bone marrow biopsy is requested.  Procedures Performed:  1. CT guided bone marrow aspiration and core biopsy  Interventional Radiologist:  Sterling Big, MD  Sedation: Moderate (conscious) sedation was used. Two mg Versed, 100 mcg Fentanyl were administered intravenously.  The patient's vital signs were monitored continuously by radiology nursing throughout the procedure.  Sedation Time: 10 minutes  PROCEDURE/FINDINGS:  Informed consent was obtained from the patient following explanation of the procedure, risks, benefits and alternatives. The patient understands, agrees and consents for the procedure. All questions were addressed.  A time out was performed.  The patient was positioned prone and noncontrast localization CT was performed of the pelvis to demonstrate the iliac marrow spaces.  Maximal barrier sterile technique utilized including caps, mask, sterile gowns, sterile gloves, large sterile drape, hand hygiene, and betadine prep.  Under sterile conditions and local anesthesia, an 11 gauge  coaxial bone biopsy needle was advanced into the right iliac marrow space. Needle position was confirmed with CT imaging. Initially, bone marrow aspiration was performed. Next, the 11 gauge outer cannula was utilized to obtain a right iliac bone marrow core biopsy. Needle was removed. Hemostasis was obtained with compression. The patient tolerated the procedure well. Samples were prepared  with the cytotechnologist. No immediate complications.  IMPRESSION:  CT guided right iliac bone marrow aspiration and core biopsy.  Signed,  Sterling Big, MD Vascular & Interventional Radiologist [facility] Radiology   Original Report Authenticated By: Malachy Moan, M.D.     Medications: I have reviewed the patient's current medications.  Assessment/Plan: #1 Fever and Neutropenia: stable and I anticipate discharging him to home tomorrow. I appreciate Dr. Precious Reel help.   LOS: 4 days   Raymonde Hamblin G 01/09/2013, 7:28 PM

## 2013-01-09 NOTE — Progress Notes (Signed)
Medical Oncology  Patient stable overnight, no complaints this AM, no problems at bone marrow site and remains afebrile. Eating breakfast, Dr Eloise Harman has just made rounds. Sinus xrays showed no air fluid levels, tho mucosal thickening and partial opacification left maxillary sinus > right.  CBC today with WBC up to 2.4, ANC 0.6, Hgb better at 9.6 and plt clumped but adequate.  We should get some information from the bone marrow pathology later today. I will discuss information with patient after that is available, either midday or after office hours.  L.Livesay, MD [phone]

## 2013-01-09 NOTE — Progress Notes (Addendum)
ANTIBIOTIC CONSULT NOTE - FOLLOW UP  Pharmacy Consult for Vancomycin Indication: r/o PNA, fever, unexplained neutropenia  No Known Allergies  Patient Measurements: Height: 5\' 11"  (180.3 cm) Weight: 158 lb 8.2 oz (71.9 kg) IBW/kg (Calculated) : 75.3  Vital Signs: Temp: 98.6 F (37 C) (03/19 0551) Temp src: Oral (03/19 0551) BP: 138/59 mmHg (03/19 0551) Pulse Rate: 84 (03/19 0551) Intake/Output from previous day: 03/18 0701 - 03/19 0700 In: 1769.6 [P.O.:360; I.V.:1409.6] Out: 1850 [Urine:1850] Intake/Output from this shift: Total I/O In: 120 [P.O.:120] Out: 200 [Urine:200]  Labs:  Recent Labs  01/07/13 0355 01/08/13 0352 01/09/13 0345  WBC 1.5* 1.8* 2.4*  HGB 8.8* 8.9* 9.6*  PLT 150 177 PLATELET CLUMPS NOTED ON SMEAR, COUNT APPEARS ADEQUATE  CREATININE 1.02  --   --    Estimated Creatinine Clearance: 56.8 ml/min (by C-G formula based on Cr of 1.02).  Recent Labs  01/09/13 0845  VANCOTROUGH 17.9     Microbiology: Recent Results (from the past 720 hour(s))  CULTURE, BLOOD (ROUTINE X 2)     Status: None   Collection Time    01/05/13 11:00 PM      Result Value Range Status   Specimen Description BLOOD LEFT ARM   Final   Special Requests BOTTLES DRAWN AEROBIC AND ANAEROBIC 5CC EACH   Final   Culture  Setup Time 01/06/2013 15:02   Final   Culture     Final   Value:        BLOOD CULTURE RECEIVED NO GROWTH TO DATE CULTURE WILL BE HELD FOR 5 DAYS BEFORE ISSUING A FINAL NEGATIVE REPORT   Report Status PENDING   Incomplete  CULTURE, BLOOD (ROUTINE X 2)     Status: None   Collection Time    01/05/13 11:00 PM      Result Value Range Status   Specimen Description BLOOD RIGHT HAND   Final   Special Requests BOTTLES DRAWN AEROBIC AND ANAEROBIC 4CC EACH   Final   Culture  Setup Time 01/06/2013 15:02   Final   Culture     Final   Value:        BLOOD CULTURE RECEIVED NO GROWTH TO DATE CULTURE WILL BE HELD FOR 5 DAYS BEFORE ISSUING A FINAL NEGATIVE REPORT   Report Status  PENDING   Incomplete  URINE CULTURE     Status: None   Collection Time    01/07/13  9:51 AM      Result Value Range Status   Specimen Description URINE, CLEAN CATCH   Final   Special Requests vanc, zosyn Immunocompromised   Final   Culture  Setup Time 01/07/2013 15:48   Final   Colony Count NO GROWTH   Final   Culture NO GROWTH   Final   Report Status 01/08/2013 FINAL   Final    Anti-infectives   Start     Dose/Rate Route Frequency Ordered Stop   01/06/13 1000  piperacillin-tazobactam (ZOSYN) IVPB 3.375 g     3.375 g 12.5 mL/hr over 240 Minutes Intravenous Every 8 hours 01/06/13 0934     01/06/13 1000  vancomycin (VANCOCIN) IVPB 1000 mg/200 mL premix     1,000 mg 200 mL/hr over 60 Minutes Intravenous Every 12 hours 01/06/13 0945     01/06/13 0200  cefTRIAXone (ROCEPHIN) 1 g in dextrose 5 % 50 mL IVPB     1 g 100 mL/hr over 30 Minutes Intravenous  Once 01/06/13 0146 01/06/13 0244   01/05/13 2245  cefTRIAXone (ROCEPHIN)  1 g in dextrose 5 % 50 mL IVPB  Status:  Discontinued     1 g 100 mL/hr over 30 Minutes Intravenous Every 24 hours 01/05/13 2233 01/06/13 0146   01/05/13 2245  vancomycin (VANCOCIN) IVPB 1000 mg/200 mL premix     1,000 mg 200 mL/hr over 60 Minutes Intravenous  Once 01/05/13 2243 01/06/13 0045   01/05/13 2245  piperacillin-tazobactam (ZOSYN) IVPB 3.375 g     3.375 g 12.5 mL/hr over 240 Minutes Intravenous  Once 01/05/13 2243 01/06/13 0116     Assessment: 84 yoM presented 3/15 at [facility] with fever, fatigue. Labs last month per PCP: leukopenia, with predominance of lymphocytes. Begin Vancomycin, Zosyn at [facility] 3/16 for  suspected PNA, r/o UTI. Severe neutropenia noted on admit: WBC 1.5, ANC 0.4  Past medical history is significant for recent early-stage melanoma resection from nose tissue  Day 4 Vancomycin 1gm q12, Zosyn per MD 3.375 gm q8h.   ANC up to 0.6 today, Bone marrow biopsy completed 3/18, plan results today.  No growth blood cultures from 3/15,  urine cx collected 3/17 after abx begun; no growth final.  Vanc trough 17.9, in range  Goal of Therapy:  Vancomycin trough level 15-20 mcg/ml  Plan:  No change Vancomycin 1gm q12 No change Cleone Slim PharmD Pager [phone] 01/09/2013, 10:09 AM

## 2013-01-09 NOTE — Progress Notes (Signed)
Medical Oncology  Discussed bone marrow findings to this point with Dr Colonel Bald: morphologically appears to have marrow involvement with lymphocytes, suggests a low grade lymphoma tho special stains are all still pending. I have discussed with Dr Eloise Harman by phone, told patient this information now, and discussed with wife by phone also; I will try to speak with daughter later this afternoon. We will need to wait for full pathology information, but likely will have reasonable treatment options available.  He has been stable today, but not OOB except to BR. 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 I will also try neupogen injection as this may get ANC into a little better range for him to be out of hospital. Would consider cipro 250 mg bid thru weekend if he is discharged.  I expect that I will see him back at office on 01-14-13.  Patient followed discussion well and did not have other questions now.  L.Livesay, MD [phone]

## 2013-01-10 DIAGNOSIS — J322 Chronic ethmoidal sinusitis: Secondary | ICD-10-CM | POA: Diagnosis present

## 2013-01-10 DIAGNOSIS — D649 Anemia, unspecified: Secondary | ICD-10-CM | POA: Diagnosis present

## 2013-01-10 DIAGNOSIS — J329 Chronic sinusitis, unspecified: Secondary | ICD-10-CM

## 2013-01-10 DIAGNOSIS — C9200 Acute myeloblastic leukemia, not having achieved remission: Principal | ICD-10-CM

## 2013-01-10 LAB — CBC WITH DIFFERENTIAL/PLATELET
Eosinophils Relative: 3 % (ref 0–5)
Lymphs Abs: 0.9 10*3/uL (ref 0.7–4.0)
MCH: 39.8 pg — ABNORMAL HIGH (ref 26.0–34.0)
MCV: 108.5 fL — ABNORMAL HIGH (ref 78.0–100.0)
Monocytes Absolute: 0.2 10*3/uL (ref 0.1–1.0)
Platelets: 169 10*3/uL (ref 150–400)
RBC: 2.36 MIL/uL — ABNORMAL LOW (ref 4.22–5.81)

## 2013-01-10 MED ORDER — VANCOMYCIN HCL IN DEXTROSE 1-5 GM/200ML-% IV SOLN: 1000.0000 mg | Freq: Two times a day (BID) | INTRAVENOUS | Status: DC

## 2013-01-10 MED ORDER — AMOXICILLIN-POT CLAVULANATE 875-125 MG PO TABS: 1.0000 | ORAL_TABLET | Freq: Two times a day (BID) | ORAL | Status: DC

## 2013-01-10 MED ORDER — PIPERACILLIN-TAZOBACTAM 3.375 G IVPB: 3.3750 g | Freq: Three times a day (TID) | INTRAVENOUS | Status: DC

## 2013-01-10 MED ORDER — ACETAMINOPHEN 500 MG PO TABS: 500.0000 mg | ORAL_TABLET | Freq: Four times a day (QID) | ORAL | Status: DC | PRN

## 2013-01-10 MED ORDER — OXYMETAZOLINE HCL 0.05 % NA SOLN: 2.0000 | Freq: Two times a day (BID) | NASAL | Status: DC

## 2013-01-10 NOTE — Care Management Note (Signed)
    Page 1 of 1   01/10/2013     11:53:01 AM   CARE MANAGEMENT NOTE 01/10/2013  Patient:  GAITHER, BIEHN   Account Number:  [id number]  Date Initiated:  01/09/2013  Documentation initiated by:  McGIBBONEY,COOKIE  Subjective/Objective Assessment:   pt admitted with cco PNA, with Neutropenia     Action/Plan:   from home   Anticipated DC Date:  01/10/2013   Anticipated DC Plan:  ACUTE TO ACUTE TRANS      DC Planning Services  CM consult      Choice offered to / List presented to:             Status of service:  Completed, signed off Medicare Important Message given?  NA - LOS <3 / Initial given by admissions (If response is "NO", the following Medicare IM given date fields will be blank) Date Medicare IM given:   Date Additional Medicare IM given:    Discharge Disposition:  ACUTE TO ACUTE TRANS  Per UR Regulation:  Reviewed for med. necessity/level of care/duration of stay  If discussed at Long Length of Stay Meetings, dates discussed:    Comments:  01/10/13 Brownie Gockel RN,BSN NCM [phone] FOR TRANSFER TO [facility].PER [facility]SPORT[facility] VIA [facility].HAS A BED.  01/09/13 MPearson, RN, BSN chart reviewed.

## 2013-01-10 NOTE — Progress Notes (Signed)
Vitals called to [employer].  Pt has a bed at [facility], [place] [address].  Dr. Darrold Span is aware.

## 2013-01-10 NOTE — Discharge Summary (Addendum)
Physician Discharge Summary  Patient ID: Shawn Goodman MRN: [id number] DOB/AGE: 1927-11-03 79 y.o.  Admit date: 01/05/2013 Discharge date: 01/10/2013   Discharge Diagnoses:  Principal Problem:   Community acquired pneumonia Active Problems:   Fever   Neutropenia   Ethmoid sinusitis   Anemia   Discharged Condition: good  Hospital Course:   Patient is an 79 year old Caucasian man with a fairly unremarkable past medical history who presented to the emergency room on my advice after a few days of progressive fatigue and intermittent fevers. He has not had productive cough, shortness of breath, chest pain, nausea, abdominal pain, dysuria, or frequency. 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 At his annual physical exam last month he had anemia with leukopenia and a predominance of lymphocytes. The workup in the emergency room was significant for bibasilar patchy airspace disease, bacteriuria, and severe leukopenia with moderately severe anemia and neutropenia. He was started on broad-spectrum antibiotics and admitted for further evaluation. Currently he feels fine with no shortness of breath or cough his appetite is fair to good. Has no abdominal pain or nausea or vomiting. He is concerned about his wife who has severe problems with gait that requires his assistance. His daughter was with him at my exam and they're planning on some home health care assistance for now.  Throughout his hospital course he did not have a productive cough or shortness breath. He was started on broad-spectrum antibiotics with Zosyn and vancomycin. He was seen by a hematology and oncology consultant, Dr. Darrold Span. She recommended a bone marrow biopsy which was done with CT guidance with preliminary results showing lymphoma versus lymphocytic leukemia. He also had sinus X-rays which suggested left maxillary sinusitis, so a sinus  decongestant was added. On the day of anticipated discharge to home the pathologist felt that he could have AML upon further testing, so plans are underway for possible transfer to a tertiary center for treatment of acute leukemia.  Consults: hematology/oncology  Significant Diagnostic Studies:  Dg Sinuses Complete  01/08/2013  *RADIOLOGY REPORT*  Clinical Data: Sinus drainage.  Pain forehead and left-side of head  PARANASAL SINUSES - COMPLETE 3 + VIEW  Comparison: None.  Findings: Mucosal thickening/partial opacification maxillary sinuses more notable on the left.  Remainder of the paranasal sinuses appear clear by plain film examination.  IMPRESSION: Mucosal thickening/partial opacification maxillary sinuses more notable on the left.   Original Report Authenticated By: Lacy Duverney, M.D.    Ct Biopsy  01/08/2013  *RADIOLOGY REPORT*  CT GUIDED RIGHT ILIAC BONE MARROW ASPIRATION AND BONE MARROW CORE BIOPSIES  Date:  Clinical History: 79 year old male with neutropenia and anemia. Bone marrow biopsy is requested.  Procedures Performed:  1. CT guided bone marrow aspiration and core biopsy  Interventional Radiologist:  Sterling Big, MD  Sedation: Moderate (conscious) sedation was used. Two mg Versed, 100 mcg Fentanyl were administered intravenously.  The patient's vital signs were monitored continuously by radiology nursing throughout the procedure.  Sedation Time: 10 minutes  PROCEDURE/FINDINGS:  Informed consent was obtained from the patient following explanation of the procedure, risks, benefits and alternatives. The patient understands, agrees and consents for the procedure. All questions were addressed.  A time out was performed.  The patient was positioned prone and noncontrast localization CT was performed of the pelvis to demonstrate the iliac marrow spaces.  Maximal barrier sterile technique utilized including caps, mask, sterile gowns, sterile gloves, large sterile  drape, hand hygiene, and  betadine prep.  Under sterile conditions and local anesthesia, an 11 gauge coaxial bone biopsy needle was advanced into the right iliac marrow space. Needle position was confirmed with CT imaging. Initially, bone marrow aspiration was performed. Next, the 11 gauge outer cannula was utilized to obtain a right iliac bone marrow core biopsy. Needle was removed. Hemostasis was obtained with compression. The patient tolerated the procedure well. Samples were prepared with the cytotechnologist. No immediate complications.  IMPRESSION:  CT guided right iliac bone marrow aspiration and core biopsy.  Signed,  Sterling Big, MD Vascular & Interventional Radiologist [facility]   Original Report Authenticated By: Malachy Moan, M.D.     Labs: Lab Results  Component Value Date   WBC 1.9* 01/10/2013   HGB 9.4* 01/10/2013   HCT 25.6* 01/10/2013   MCV 108.5* 01/10/2013   PLT 169 01/10/2013      Recent Labs Lab 01/07/13 0355  NA 135  K 4.2  CL 102  CO2 23  BUN 17  CREATININE 1.02  CALCIUM 8.4  PROT 5.6*  BILITOT 0.3  ALKPHOS 42  ALT 15  AST 15  GLUCOSE 111*       No results found for this basename: INR,  PROTIME     Recent Results (from the past 240 hour(s))  CULTURE, BLOOD (ROUTINE X 2)     Status: None   Collection Time    01/05/13 11:00 PM      Result Value Range Status   Specimen Description BLOOD LEFT ARM   Final   Special Requests BOTTLES DRAWN AEROBIC AND ANAEROBIC 5CC EACH   Final   Culture  Setup Time 01/06/2013 15:02   Final   Culture     Final   Value:        BLOOD CULTURE RECEIVED NO GROWTH TO DATE CULTURE WILL BE HELD FOR 5 DAYS BEFORE ISSUING A FINAL NEGATIVE REPORT   Report Status PENDING   Incomplete  CULTURE, BLOOD (ROUTINE X 2)     Status: None   Collection Time    01/05/13 11:00 PM      Result Value Range Status   Specimen Description BLOOD RIGHT HAND   Final   Special Requests BOTTLES DRAWN AEROBIC AND ANAEROBIC 4CC EACH   Final   Culture  Setup  Time 01/06/2013 15:02   Final   Culture     Final   Value:        BLOOD CULTURE RECEIVED NO GROWTH TO DATE CULTURE WILL BE HELD FOR 5 DAYS BEFORE ISSUING A FINAL NEGATIVE REPORT   Report Status PENDING   Incomplete  URINE CULTURE     Status: None   Collection Time    01/07/13  9:51 AM      Result Value Range Status   Specimen Description URINE, CLEAN CATCH   Final   Special Requests vanc, zosyn Immunocompromised   Final   Culture  Setup Time 01/07/2013 15:48   Final   Colony Count NO GROWTH   Final   Culture NO GROWTH   Final   Report Status 01/08/2013 FINAL   Final      Discharge Exam: Blood pressure 133/60, pulse 87, temperature 99.3 F (37.4 C), temperature source Oral, resp. rate 20, height 5\' 11"  (1.803 m), weight 71.9 kg (158 lb 8.2 oz), SpO2 95.00%.  Physical Exam: In general, he is a well-nourished well-developed white man who was in no apparent distress. HEENT exam was significant for a  healing Mohs surgery on his nose. Neck was supple without jugular venous distention, chest was clear to auscultation, heart had a regular rate and rhythm, abdomen had normal bowel sounds no tenderness, extremities were without cyanosis, clubbing, or edema. Neurologic exam was nonfocal  Disposition: He'll be transferred from this hospital to a tertiary center for treatment of acute myelocytic leukemia under the direction of Dr. Darrold Span.      Discharge Orders   Future Orders Complete By Expires     Call MD for:  As directed     Comments:      Call Dr. Lanell Matar office or Dr. Darrold Span for temperature over 102 degrees, weakness, severe diarrhea, or other concerning symptoms.    Diet - low sodium heart healthy  As directed     Discharge instructions  As directed     Comments:      You will be discharged to home today. Given decreased immune protection I would stay at home as much as possible and avoid contact with non-family members. You should see Dr. Darrold Span in followup next week.     Increase activity slowly  As directed         Medication List    TAKE these medications       acetaminophen 500 MG tablet  Commonly known as:  TYLENOL  Take 1 tablet (500 mg total) by mouth every 6 (six) hours as needed for pain or fever.     MULTI FOR HIM 50+ PO  Take 1 tablet by mouth daily. GNC pack/day     oxymetazoline 0.05 % nasal spray  Commonly known as:  AFRIN NASAL SPRAY  Place 2 sprays into the nose 2 (two) times daily.     piperacillin-tazobactam 3.375 GM/50ML IVPB  Commonly known as:  ZOSYN  Inject 50 mLs (3.375 g total) into the vein every 8 (eight) hours.     vancomycin 1 GM/200ML Soln  Commonly known as:  VANCOCIN  Inject 200 mLs (1,000 mg total) into the vein every 12 (twelve) hours.       Follow-up Information   Follow up with LIVESAY,LENNIS P, MD. Schedule an appointment as soon as possible for a visit in 1 week.   Contact information:   [address] [place] [address] [phone]       Follow up with ARONSON,RICHARD A, MD. Schedule an appointment as soon as possible for a visit in 1 week.   Contact information:   [address] [facility] [place] [address] [phone]       Signed: Garlan Fillers 01/10/2013, 8:24 AM

## 2013-01-10 NOTE — Progress Notes (Signed)
01/10/2013, 9:20 AM  Hospital day: 6 Antibiotics: day 6 vancomycin, zosyn  Contacted this AM by Dr Colonel Bald (pathology) that information now from flow shows increased blasts consistent with AML. I have spoken with Dr Jarome Matin of primary service, with patient now and with daughter Augustin Schooling by phone [phone]. Daughter is en route to hospital and I will speak with her further here.  At request of Mr Seddon and his daughter, I have contacted the acute leukemia service at [facility]. Dr Austin Miles has discussed with me and agrees to accept Mr Maiello in transfer, which will be arranged.   Subjective: Feeling ok, able to sleep last pm, one night sweat not as drenching as previously, not up to void as frequently thru night since IVF rate decreased. No bleeding. Post nasal drainage unchanged. Reports 3-4 BMs yesterday "a little loose". No cough or SOB. Walked just in room yesterday. Appetite good. No mouth soreness. No pain. Sweats have been occurring for ~ 4-6 weeks.  Objective: Vital signs in last 24 hours: Blood pressure 133/60, pulse 87, temperature 99.3 F (37.4 C), temperature source Oral, resp. rate 20, height 5\' 11"  (1.803 m), weight 158 lb 8.2 oz (71.9 kg), SpO2 95.00%.   Intake/Output from previous day: 03/19 0701 - 03/20 0700 In: 1086 [P.O.:710; I.V.:176; IV Piggyback:200] Out: 1450 [Urine:1450] I Physical exam: awake, alert, oriented and appropriate, NAD supine in bed on RA. Somewhat pale, not icteric. PERRL. Oral mucosa moist and clear, mucous membranes somewhat pale. Skin graft on nose better healed than on admission, no bleeding, no tenderness. Lungs without wheezes or rales. Heart RRR. Abdomen soft, nontender, normal bowel sounds, no HSM appreciated. Peripheral IV LUE site ok. LE no edema, cords, tenderness. Moves all extremities easily, no focal deficits.  Lab Results: CBC today with WBC 1.9, ANC 0.7, Hgb 9.4/ Hct 25.6, MCV 108.5, plt 169k, neutrophils 36%,  lymphs 53%, monos 8%, eos 3  Last CMET 01-07-13 Blood cultures x2 from admission 01-05-13 negative to date Urine culture 01-07-13 no growth (on antibiotics)  Pathology report of bone marrow transcription pending. I have spoken directly with Dr Pecola Leisure and with staff at [facility] pathology department [phone]), who will send specimens to [facility].  Studies/Results: Dg Sinuses Complete  01/08/2013  *RADIOLOGY REPORT*  Clinical Data: Sinus drainage.  Pain forehead and left-side of head  PARANASAL SINUSES - COMPLETE 3 + VIEW  Comparison: None.  Findings: Mucosal thickening/partial opacification maxillary sinuses more notable on the left.  Remainder of the paranasal sinuses appear clear by plain film examination.  IMPRESSION: Mucosal thickening/partial opacification maxillary sinuses more notable on the left.   Original Report Authenticated By: Lacy Duverney, M.D.    Ct Biopsy  01/08/2013  *RADIOLOGY REPORT*  CT GUIDED RIGHT ILIAC BONE MARROW ASPIRATION AND BONE MARROW CORE BIOPSIES  Date:  Clinical History: 79 year old male with neutropenia and anemia. Bone marrow biopsy is requested.  Procedures Performed:  1. CT guided bone marrow aspiration and core biopsy  Interventional Radiologist:  Sterling Big, MD  Sedation: Moderate (conscious) sedation was used. Two mg Versed, 100 mcg Fentanyl were administered intravenously.  The patient's vital signs were monitored continuously by radiology nursing throughout the procedure.  Sedation Time: 10 minutes  PROCEDURE/FINDINGS:  Informed consent was obtained from the patient following explanation of the procedure, risks, benefits and alternatives. The patient understands, agrees and consents for the procedure. All questions were addressed.  A time out was performed.  The patient was positioned prone and  noncontrast localization CT was performed of the pelvis to demonstrate the iliac marrow spaces.  Maximal barrier sterile technique utilized including  caps, mask, sterile gowns, sterile gloves, large sterile drape, hand hygiene, and betadine prep.  Under sterile conditions and local anesthesia, an 11 gauge coaxial bone biopsy needle was advanced into the right iliac marrow space. Needle position was confirmed with CT imaging. Initially, bone marrow aspiration was performed. Next, the 11 gauge outer cannula was utilized to obtain a right iliac bone marrow core biopsy. Needle was removed. Hemostasis was obtained with compression. The patient tolerated the procedure well. Samples were prepared with the cytotechnologist. No immediate complications.  IMPRESSION:  CT guided right iliac bone marrow aspiration and core biopsy.  Signed,  Sterling Big, MD Vascular & Interventional Radiologist [facility] Radiology   Original Report Authenticated By: Malachy Moan, M.D.     Daughter now at hospital and I have talked with her at bedside now. They are both particularly concerned about patient's wife/ her mother, who has extremely limited mobility and some decreased cognition; Mr Hillyard has been her primary caregiver at home. Daughter does not work, lives in [place] and is assisting her mother now.  Assessment/Plan: 1. AML newly diagnosed in 79 yo man who has been in generally very good health until this, with normal blood counts in Feb 2013, but mild anemia and leukopenia by counts 12-10-12 (see H&P for counts).  He is neutropenic and anemic, platelets maintained. He has been on broad spectrum antibiotics with vancomycin and zosyn since admission on 01-05-13, no positive cultures. Will transfer to acute leukemia service at [facility] today, accepting MD Dr Austin Miles. 2.superficial melanoma post [facility] with skin graft within past month 3.hx BPH, followed by urology in [facility] 4.colonoscopy Sept 2013: severe diverticulosis noted thruout colon, 4 polyps 3-7 mm 5.history of sinusitis with mucosal thickening maxillary sinuses by imaging this  admission  Patient will be transferred to [facility] room 637.  Time spent including discussion and coordination of care 90 min.  Reece Packer [facility] [phone]

## 2013-01-10 NOTE — Progress Notes (Signed)
Report called to [facility] RN Alger Memos.  Pt being transported by [facility].

## 2013-01-12 LAB — CULTURE, BLOOD (ROUTINE X 2): Culture: NO GROWTH

## 2013-01-14 LAB — CHROMOSOME ANALYSIS, BONE MARROW

## 2013-01-31 ENCOUNTER — Encounter: Payer: Self-pay | Admitting: Oncology

## 2013-02-11 ENCOUNTER — Encounter: Payer: Self-pay | Admitting: Adult Health

## 2013-02-12 ENCOUNTER — Non-Acute Institutional Stay (SKILLED_NURSING_FACILITY): Payer: Medicare Other | Admitting: Internal Medicine

## 2013-02-12 DIAGNOSIS — D61810 Antineoplastic chemotherapy induced pancytopenia: Secondary | ICD-10-CM

## 2013-02-12 DIAGNOSIS — I951 Orthostatic hypotension: Secondary | ICD-10-CM

## 2013-02-12 DIAGNOSIS — T451X5A Adverse effect of antineoplastic and immunosuppressive drugs, initial encounter: Secondary | ICD-10-CM

## 2013-02-12 DIAGNOSIS — C9200 Acute myeloblastic leukemia, not having achieved remission: Secondary | ICD-10-CM

## 2013-02-12 DIAGNOSIS — K5732 Diverticulitis of large intestine without perforation or abscess without bleeding: Secondary | ICD-10-CM

## 2013-02-26 NOTE — Progress Notes (Signed)
Patient ID: Shawn Goodman, male   DOB: 24-Aug-1928, 79 y.o.   MRN: [id number]        HISTORY & PHYSICAL  DATE:  02/12/2013  FACILITY: [facility]   LEVEL OF CARE: SNF   ALLERGIES:  No Known Allergies  CHIEF COMPLAINT:  Manage acute myeloid leukemia, diverticulitis and orthostatic hypotension.    HISTORY OF PRESENT ILLNESS:  79 year-old, Caucasian male was diagnosed with AML per bone marrow biopsy.  He received chemotherapy and his nadir bone marrow showed an excellent cycle reduction from pre-treatment marrow.  He is admitted to this facility for short-term rehabilitation.  He is to follow up with oncologist.   ACUTE SIGMOID DIVERTICULITIS:  The patient started complaining of abdominal pains and CT of the abdomen showed acute sigmoid diverticulitis without an abscess.  He was initially treated with cefepime and Flagyl, then transitioned to oral Cipro and Flagyl.  He denies further abdominal pains.    ORTHOSTATIC HYPOTENSION: The patient received IV fluid boluses for symptomatic orthostatic hypotension.  Cardiology evaluated him for postherpetic cardio syndrome, but felt patient's abnormal vital signs were due to deconditioned state.  Therefore, no further workup was indicated.   The orthostatic hypotension remains stable.  Pt is tolerating current medications without any adverse reactions.  Patient denies further dizziness or lightheadedness reported.   PAST MEDICAL HISTORY :  No past medical history on file.  PAST SURGICAL HISTORY: Past Surgical History  Procedure Laterality Date  . Cataract extraction w/ intraocular lens  implant, bilateral    . Inguinal hernia repair  1953  . 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 Tonsillectomy and adenoidectomy  1939  . Melanoma removal      SOCIAL HISTORY:  reports that he has quit smoking. His smoking use included Pipe and Cigars. He has never used smokeless tobacco. He reports that he does not drink alcohol or use illicit  drugs. TOBACCO USE:  He used to smoke cigars, but quit.   ALCOHOL:  Drinks alcohol occasionally.   ILLICIT DRUGS:  Denies illicit drug use.   EMPLOYMENT HISTORY:  He is a retired Forensic scientist.    FAMILY HISTORY:  Family History  Problem Relation Age of Onset  . Heart disease Father   SIBLINGS:  Brother died from MS.   FATHER:  Father died at age 68.   MOTHER:  Mother died at age 91.    CURRENT MEDICATIONS: Reviewed per [employer]  REVIEW OF SYSTEMS:  See HPI otherwise 14 point ROS is negative.  PHYSICAL EXAMINATION  VS:  T  98.6      P 93      RR  10     BP  129/76     POX 93% room air        WT (Lb)  GENERAL: no acute distress, normal body habitus SKIN: warm & dry, no suspicious lesions or rashes, no excessive dryness EYES: conjunctivae normal, sclerae normal, normal eye lids MOUTH/THROAT: lips without lesions,no lesions in the mouth,tongue is without lesions,uvula elevates in midline NECK: supple, trachea midline, no neck masses, no thyroid tenderness, no thyromegaly LYMPHATICS: no LAN in the neck, no supraclavicular LAN RESPIRATORY: breathing is even & unlabored, BS CTAB CARDIAC: RRR, no murmur,no extra heart sounds, no edema GI:  ABDOMEN: abdomen soft, normal BS, no masses, no tenderness  LIVER/SPLEEN: no hepatomegaly, no splenomegaly MUSCULOSKELETAL: HEAD: normal to inspection & palpation BACK: no kyphosis, scoliosis or spinal processes tenderness EXTREMITIES: LEFT UPPER EXTREMITY: full  range of motion, normal strength & tone RIGHT UPPER EXTREMITY:  full range of motion, normal strength & tone LEFT LOWER EXTREMITY:  full range of motion, normal strength & tone RIGHT LOWER EXTREMITY:  full range of motion, normal strength & tone PSYCHIATRIC: the patient is alert & oriented to person, affect & behavior appropriate  LABS/RADIOLOGY:  None available from hospitalization.    ASSESSMENT/PLAN:  Acute myeloid leukemia.  Status post chemotherapy.  Follow up with oncologist.     Acute diverticulitis.  Continue Cipro and Flagyl as prescribed.    Orthostatic hypotension.  No further symptoms.    Chemotherapy-induced pancytopenia.  Reassess platelet count.  Platelet goal was 20.  He also received platelet transfusions.    Anemia.  Patient received blood transfusion and at discharge hemoglobin was 8.  Reassess.  Check CBC and BMP.    I have reviewed patient's medical records received at admission/from hospitalization.  CPT CODE: 96295

## 2013-03-13 ENCOUNTER — Encounter: Payer: Self-pay | Admitting: Adult Health

## 2013-03-25 ENCOUNTER — Telehealth: Payer: Self-pay | Admitting: Oncology

## 2013-03-25 NOTE — Telephone Encounter (Signed)
C/D 03/25/13 for appt. 03/27/13

## 2013-03-27 ENCOUNTER — Telehealth: Payer: Self-pay | Admitting: Oncology

## 2013-03-27 ENCOUNTER — Encounter: Payer: Self-pay | Admitting: Oncology

## 2013-03-27 ENCOUNTER — Other Ambulatory Visit (HOSPITAL_BASED_OUTPATIENT_CLINIC_OR_DEPARTMENT_OTHER): Payer: Medicare Other | Admitting: Lab

## 2013-03-27 ENCOUNTER — Ambulatory Visit: Payer: Medicare Other

## 2013-03-27 ENCOUNTER — Ambulatory Visit (HOSPITAL_BASED_OUTPATIENT_CLINIC_OR_DEPARTMENT_OTHER): Payer: Medicare Other | Admitting: Oncology

## 2013-03-27 VITALS — BP 106/71 | HR 103 | Temp 97.3°F | Resp 20 | Ht 71.0 in | Wt 134.9 lb

## 2013-03-27 DIAGNOSIS — C9201 Acute myeloblastic leukemia, in remission: Secondary | ICD-10-CM

## 2013-03-27 DIAGNOSIS — C9200 Acute myeloblastic leukemia, not having achieved remission: Secondary | ICD-10-CM

## 2013-03-27 DIAGNOSIS — I951 Orthostatic hypotension: Secondary | ICD-10-CM

## 2013-03-27 DIAGNOSIS — D649 Anemia, unspecified: Secondary | ICD-10-CM

## 2013-03-27 DIAGNOSIS — R5383 Other fatigue: Secondary | ICD-10-CM

## 2013-03-27 NOTE — Progress Notes (Signed)
Checked in new patient. Did not ask about POA/Living will. No financial issues. The daughter Sharl Ma signed for patient. He gave the ok for her to do so.

## 2013-03-27 NOTE — Progress Notes (Signed)
[facility]  Telephone:([phone]) [phone] [phone]     INITIAL HEMATOLOGY CONSULTATION    Referral MD:  Miachel Roux, M.D.  Reason for Referral:  Local oncologist for supportive therapy while he undergoes treatment for AML.     HPI:  [name] is an 79 year old man who developed fatigue, weight loss, fever in 12/2012.  He was noted to be neutropenic and anemic. Dr. Darrold Span started the work up.  Bone marrow biopsy showed AML.  He was transferred to [facility] and received chemoinduction with CTSU [phone] protocol.  He received cytarabine 100mg /m2 IV q24 hours x  7 doses and daunorubicin 60mg /m2 IV q24 hours x 3 doses.  He achieved remission per restaging bone marrow biopsy on day 13. Marland Kitchen He tolerated chemo very well except for what sounded like diverticulitis.  He received one dose of HDAraC  1.5 gm/m2 IV q24 hours x 6 doses started on 03/15/13.  He was discharged to [facility].  He needs qMon/Thu CBC, CMET, Mag and transfusion for Hgb <8 or Plat <10.  Due to transportation issue, he was referred back to [facility] for supportive care.  He will continue to get chemo at [facility] with the 2nd [facility] around 04/12/13.    Shawn Goodman presented to clinic today with his daughter.  He reported feeling fatigue.  He is getting therapy at [facility].  He had orthostatic hypotension.  He denied recent syncope.  He had weight loss with chemo induction.  He gained some back but then lost weight again with consolidation therapy.  He denied fever, mucositis, SOB, chest pain, bleeding, neuropathy, nausea/vomiting, abd pain.  The rest of the 14-point review of system was negative.    Past Medical History  Diagnosis Date  . AML (acute myeloblastic leukemia) 12/2012    t(1:14)  . Melanoma 11/2012    superficial  . Diverticulitis   . Basal cell carcinoma 2005  :    Past Surgical History  Procedure Laterality Date  . Cataract extraction w/ intraocular lens  implant, bilateral      . Inguinal hernia repair  1953  . 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 Tonsillectomy and adenoidectomy  1939  . Melanoma removal    :   CURRENT MEDS: Current Outpatient Prescriptions  Medication Sig Dispense Refill  . acyclovir (ZOVIRAX) 400 MG tablet Take 400 mg by mouth 2 (two) times daily. Take 1 tablet by mouth twice daily while neutropenic. Your oncologist will tell you when to stop taking this medication.      . fluconazole (DIFLUCAN) 200 MG tablet Take 200 mg by mouth daily. Take 1 tablet by mouth daily while neutropenic. Your oncologist will tell you when to stop taking this medication.      Marland Kitchen levofloxacin (LEVAQUIN) 750 MG tablet Take 750 mg by mouth daily. Take 1 tablet (750 mg total) by mouth daily. While neutropenic. Your oncologist will tell you when to start and stop taking this medication.      . Multiple Vitamin (MULTI-VITAMINS) TABS Take 1 tablet by mouth daily. Take 1 tablet by mouth daily.      . ondansetron (ZOFRAN) 8 MG tablet Take 8 mg by mouth every 8 (eight) hours as needed. Take 1 tablet (8 mg total) by mouth every 8 (eight) hours as needed for Nausea.      . polycarbophil (FIBERCON) 625 MG tablet Take 1,250 mg by mouth 3 (three) times daily as needed.  Take 2 tablets (1,250 mg total) by mouth 3 (three) times daily as needed (For diarrhea/loose stools).      . prochlorperazine (COMPAZINE) 5 MG tablet Take 5 mg by mouth every 6 (six) hours as needed. Take 1 tablet (5 mg total) by mouth every 6 (six) hours as needed.       No current facility-administered medications for this visit.      Allergies  Allergen Reactions  . Other Other (See Comments)    Pineapple, caused problems with stomach  :  Family History  Problem Relation Age of Onset  . Heart disease Father   . Multiple sclerosis Brother   :  History   Social History  . Marital Status: Married    Spouse Name: N/A    Number of Children: 1  . Years of Education: N/A   Occupational History   .      retired Automotive engineer; Merchandiser, retail, insurance, finace   Social History Main Topics  . Smoking status: Former Smoker    Types: Pipe, Software engineer  . Smokeless tobacco: Never Used  . Alcohol Use: No     Comment: rarely wine  . Drug Use: No  . Sexually Active: Not on file   Other Topics Concern  . Not on file   Social History Narrative  . No narrative on file  :  REVIEW OF SYSTEM:  The rest of the 14-point review of sytem was negative.   Exam: ECOG 1-2  General:  Thin appearing man, in no acute distress.  Eyes:  no scleral icterus.  ENT:  There were no oropharyngeal lesions.  Neck was without thyromegaly.  Lymphatics:  Negative cervical, supraclavicular or axillary adenopathy.  Respiratory: lungs were clear bilaterally without wheezing or crackles.  Cardiovascular:  Regular rate and rhythm, S1/S2, without murmur, rub or gallop.  There was no pedal edema.  GI:  abdomen was soft, flat, nontender, nondistended, without organomegaly.  Muscoloskeletal:  no spinal tenderness of palpation of vertebral spine.  Skin exam was without echymosis, petichae.  Neuro exam was nonfocal.  Patient was alert and oriented.  Attention was good.   Language was appropriate.  Mood was normal without depression.  Speech was not pressured.  Thought content was not tangential.    LABS:  Lab Results  Component Value Date   WBC 1.9* 01/10/2013   HGB 9.4* 01/10/2013   HCT 25.6* 01/10/2013   PLT 169 01/10/2013   GLUCOSE 111* 01/07/2013   ALT 15 01/07/2013   AST 15 01/07/2013   NA 135 01/07/2013   K 4.2 01/07/2013   CL 102 01/07/2013   CREATININE 1.02 01/07/2013   BUN 17 01/07/2013   CO2 23 01/07/2013     ASSESSMENT AND PLAN:   1.  Diagnosis:  Acute myeloid leukemia (AML). 2.  Status:  In remission.  S/p one dose of high dose cytarabine consolidation.  Next dose is due around 04/12/13. 3.  Follow up:     * Lab and possible transfusion on the following dates:  6/5; 6/9; 6/12; 6/16; 6/19; 6/27.   * Goal of  transfusion for Hgb <8 or Platelet <10.  Until Platelet >20, he will have blood draw at the [facility].   Once it improves to platelet >20, we may have it drawn at [facility] again (their platelet counter only <20 and not a specific value).   * Return visit with [facility] around 6/20.  Return visit with the [facility] on 6/30 or whenever he  is discharged from [facility].   The length of time of the face-to-face encounter was 30 minutes. More than 50% of time was spent counseling and coordination of care.     Thank you for this referral.

## 2013-03-27 NOTE — Patient Instructions (Addendum)
1.  Diagnosis:  Acute myeloid leukemia (AML). 2.  Status:  In remission.  S/p one dose of high dose cytarabine consolidation.  Next dose is due around 04/12/13. 3.  Follow up:     * Lab and possible transfusion on the following dates:  6/5; 6/9; 6/12; 6/16; 6/19; 6/27.   * Goal of transfusion for Hgb <8 or Platelet <10.  Until Platelet >20, he will have blood draw at the [facility].   * Return visit with [facility] around 6/20.  Return visit with the [facility] on 6/30.

## 2013-03-27 NOTE — Telephone Encounter (Signed)
gv and printed appt sched and avs for pt....emailed MW to add additional tx.

## 2013-03-28 ENCOUNTER — Other Ambulatory Visit (HOSPITAL_BASED_OUTPATIENT_CLINIC_OR_DEPARTMENT_OTHER): Payer: Medicare Other | Admitting: Lab

## 2013-03-28 ENCOUNTER — Other Ambulatory Visit: Payer: Medicare Other | Admitting: Lab

## 2013-03-28 ENCOUNTER — Telehealth: Payer: Self-pay | Admitting: *Deleted

## 2013-03-28 ENCOUNTER — Ambulatory Visit: Payer: Medicare Other

## 2013-03-28 ENCOUNTER — Non-Acute Institutional Stay (SKILLED_NURSING_FACILITY): Payer: Medicare Other | Admitting: Internal Medicine

## 2013-03-28 DIAGNOSIS — C9200 Acute myeloblastic leukemia, not having achieved remission: Secondary | ICD-10-CM

## 2013-03-28 DIAGNOSIS — D7289 Other specified disorders of white blood cells: Secondary | ICD-10-CM

## 2013-03-28 DIAGNOSIS — C9201 Acute myeloblastic leukemia, in remission: Secondary | ICD-10-CM

## 2013-03-28 DIAGNOSIS — D630 Anemia in neoplastic disease: Secondary | ICD-10-CM

## 2013-03-28 DIAGNOSIS — D696 Thrombocytopenia, unspecified: Secondary | ICD-10-CM

## 2013-03-28 LAB — CBC WITH DIFFERENTIAL/PLATELET
Basophils Absolute: 0 10*3/uL (ref 0.0–0.1)
EOS%: 7.3 % — ABNORMAL HIGH (ref 0.0–7.0)
HCT: 27.5 % — ABNORMAL LOW (ref 38.4–49.9)
HGB: 9.6 g/dL — ABNORMAL LOW (ref 13.0–17.1)
LYMPH%: 91.2 % — ABNORMAL HIGH (ref 14.0–49.0)
MCH: 30.5 pg (ref 27.2–33.4)
MCHC: 34.9 g/dL (ref 32.0–36.0)
MCV: 87.3 fL (ref 79.3–98.0)
NEUT%: 0 % — ABNORMAL LOW (ref 39.0–75.0)
Platelets: 14 10*3/uL — ABNORMAL LOW (ref 140–400)
lymph#: 1.3 10*3/uL (ref 0.9–3.3)

## 2013-03-28 LAB — COMPREHENSIVE METABOLIC PANEL (CC13)
Albumin: 3.4 g/dL — ABNORMAL LOW (ref 3.5–5.0)
BUN: 22.8 mg/dL (ref 7.0–26.0)
CO2: 24 mEq/L (ref 22–29)
Glucose: 155 mg/dl — ABNORMAL HIGH (ref 70–99)
Sodium: 130 mEq/L — ABNORMAL LOW (ref 136–145)
Total Bilirubin: 0.72 mg/dL (ref 0.20–1.20)
Total Protein: 6.4 g/dL (ref 6.4–8.3)

## 2013-03-28 LAB — MAGNESIUM (CC13): Magnesium: 2.1 mg/dl (ref 1.5–2.5)

## 2013-03-28 NOTE — Progress Notes (Unsigned)
Transfusion appts canceled for today and tomorrow per Dr. Gaylyn Rong.  Dr. Gaylyn Rong s/w pt in lobby, notified him of no need for transfusions.

## 2013-03-28 NOTE — Telephone Encounter (Signed)
Per staff message and POF I have scheduled appts.  JMW  

## 2013-04-01 ENCOUNTER — Encounter: Payer: Self-pay | Admitting: Oncology

## 2013-04-01 ENCOUNTER — Ambulatory Visit (HOSPITAL_BASED_OUTPATIENT_CLINIC_OR_DEPARTMENT_OTHER): Payer: Medicare Other

## 2013-04-01 ENCOUNTER — Encounter (HOSPITAL_COMMUNITY)
Admission: RE | Admit: 2013-04-01 | Discharge: 2013-04-01 | Disposition: A | Discharge status: Home / Self Care | Payer: Medicare Other | Source: Ambulatory Visit | Attending: Oncology | Admitting: Oncology

## 2013-04-01 ENCOUNTER — Telehealth: Payer: Self-pay | Admitting: *Deleted

## 2013-04-01 ENCOUNTER — Other Ambulatory Visit: Payer: Self-pay | Admitting: Oncology

## 2013-04-01 ENCOUNTER — Telehealth: Payer: Self-pay | Admitting: Oncology

## 2013-04-01 ENCOUNTER — Other Ambulatory Visit (HOSPITAL_BASED_OUTPATIENT_CLINIC_OR_DEPARTMENT_OTHER): Payer: Medicare Other | Admitting: Lab

## 2013-04-01 ENCOUNTER — Other Ambulatory Visit: Payer: Self-pay | Admitting: *Deleted

## 2013-04-01 VITALS — BP 110/69 | HR 101 | Temp 97.1°F | Resp 18

## 2013-04-01 DIAGNOSIS — C9200 Acute myeloblastic leukemia, not having achieved remission: Secondary | ICD-10-CM

## 2013-04-01 DIAGNOSIS — D649 Anemia, unspecified: Secondary | ICD-10-CM | POA: Insufficient documentation

## 2013-04-01 LAB — COMPREHENSIVE METABOLIC PANEL (CC13)
Albumin: 3.4 g/dL — ABNORMAL LOW (ref 3.5–5.0)
Alkaline Phosphatase: 90 U/L (ref 40–150)
BUN: 15.6 mg/dL (ref 7.0–26.0)
Calcium: 9.6 mg/dL (ref 8.4–10.4)
Chloride: 93 mEq/L — ABNORMAL LOW (ref 98–107)
Glucose: 161 mg/dl — ABNORMAL HIGH (ref 70–99)
Potassium: 4.8 mEq/L (ref 3.5–5.1)
Sodium: 126 mEq/L — ABNORMAL LOW (ref 136–145)
Total Protein: 6.7 g/dL (ref 6.4–8.3)

## 2013-04-01 LAB — CBC WITH DIFFERENTIAL/PLATELET
BASO%: 0.2 % (ref 0.0–2.0)
Eosinophils Absolute: 0 10*3/uL (ref 0.0–0.5)
HCT: 27.5 % — ABNORMAL LOW (ref 38.4–49.9)
MCHC: 35.1 g/dL (ref 32.0–36.0)
MONO#: 0.1 10*3/uL (ref 0.1–0.9)
NEUT#: 0.3 10*3/uL — CL (ref 1.5–6.5)
RBC: 3.07 10*6/uL — ABNORMAL LOW (ref 4.20–5.82)
WBC: 1.4 10*3/uL — ABNORMAL LOW (ref 4.0–10.3)
lymph#: 1 10*3/uL (ref 0.9–3.3)

## 2013-04-01 LAB — PHOSPHORUS: Phosphorus: 3.8 mg/dL (ref 2.3–4.6)

## 2013-04-01 MED ORDER — ACETAMINOPHEN 325 MG PO TABS
650.0000 mg | ORAL_TABLET | Freq: Once | ORAL | Status: AC
  Administered 2013-04-01: 650 mg via ORAL

## 2013-04-01 MED ORDER — DIPHENHYDRAMINE HCL 25 MG PO CAPS
25.0000 mg | ORAL_CAPSULE | Freq: Once | ORAL | Status: AC
  Administered 2013-04-01: 25 mg via ORAL

## 2013-04-01 NOTE — Telephone Encounter (Signed)
Per staff message and POF I have scheduled appts.  JMW  

## 2013-04-01 NOTE — Telephone Encounter (Signed)
gv and printed appt sched and avs for pt...emailed MW to add tx....pt comming back to get updated sched at nxt visit

## 2013-04-01 NOTE — Progress Notes (Signed)
Orders received from Dr. Lowell Guitar at [facility]; CBC, CMP, Mg, Phos, LDH, Uric Acid every Monday at [facility]. CBC only every Thursday at [facility]. Transfuse for Platelet count < 20 with 1 unit of Pheresed Platelets and Hgb < 9.0 with 2 units of PRBCs. All Blood Products need to be Irradiated.  Fax Lab results to Fax [phone]. Call Panic results to [phone].

## 2013-04-02 LAB — PREPARE PLATELET PHERESIS: Unit division: 0

## 2013-04-03 ENCOUNTER — Other Ambulatory Visit: Payer: Self-pay | Admitting: Oncology

## 2013-04-04 ENCOUNTER — Ambulatory Visit (HOSPITAL_BASED_OUTPATIENT_CLINIC_OR_DEPARTMENT_OTHER): Payer: Medicare Other

## 2013-04-04 ENCOUNTER — Other Ambulatory Visit (HOSPITAL_BASED_OUTPATIENT_CLINIC_OR_DEPARTMENT_OTHER): Payer: Medicare Other | Admitting: Lab

## 2013-04-04 ENCOUNTER — Other Ambulatory Visit: Payer: Self-pay | Admitting: *Deleted

## 2013-04-04 ENCOUNTER — Other Ambulatory Visit: Payer: Self-pay | Admitting: Oncology

## 2013-04-04 ENCOUNTER — Encounter: Payer: Self-pay | Admitting: *Deleted

## 2013-04-04 VITALS — BP 128/68 | HR 87 | Temp 98.1°F | Resp 20

## 2013-04-04 DIAGNOSIS — C9201 Acute myeloblastic leukemia, in remission: Secondary | ICD-10-CM

## 2013-04-04 DIAGNOSIS — C9200 Acute myeloblastic leukemia, not having achieved remission: Secondary | ICD-10-CM

## 2013-04-04 LAB — COMPREHENSIVE METABOLIC PANEL (CC13)
Albumin: 3.1 g/dL — ABNORMAL LOW (ref 3.5–5.0)
Alkaline Phosphatase: 95 U/L (ref 40–150)
BUN: 15.8 mg/dL (ref 7.0–26.0)
Creatinine: 1 mg/dL (ref 0.7–1.3)
Glucose: 216 mg/dl — ABNORMAL HIGH (ref 70–99)
Potassium: 4.6 mEq/L (ref 3.5–5.1)

## 2013-04-04 LAB — HOLD TUBE, BLOOD BANK

## 2013-04-04 LAB — CBC WITH DIFFERENTIAL/PLATELET
BASO%: 0.2 % (ref 0.0–2.0)
Eosinophils Absolute: 0 10*3/uL (ref 0.0–0.5)
MONO#: 1.2 10*3/uL — ABNORMAL HIGH (ref 0.1–0.9)
NEUT#: 6.2 10*3/uL (ref 1.5–6.5)
RBC: 2.81 10*6/uL — ABNORMAL LOW (ref 4.20–5.82)
RDW: 19.5 % — ABNORMAL HIGH (ref 11.0–14.6)
WBC: 9 10*3/uL (ref 4.0–10.3)
lymph#: 1.6 10*3/uL (ref 0.9–3.3)

## 2013-04-04 LAB — TECHNOLOGIST REVIEW

## 2013-04-04 MED ORDER — DIPHENHYDRAMINE HCL 25 MG PO CAPS
25.0000 mg | ORAL_CAPSULE | Freq: Once | ORAL | Status: AC
  Administered 2013-04-04: 25 mg via ORAL

## 2013-04-04 MED ORDER — HEPARIN SOD (PORK) LOCK FLUSH 100 UNIT/ML IV SOLN
500.0000 [IU] | Freq: Every day | INTRAVENOUS | Status: AC | PRN
  Administered 2013-04-04: 500 [IU]
  Filled 2013-04-04: qty 5

## 2013-04-04 MED ORDER — SODIUM CHLORIDE 0.9 % IJ SOLN
10.0000 mL | INTRAMUSCULAR | Status: AC | PRN
  Administered 2013-04-04: 10 mL
  Filled 2013-04-04: qty 10

## 2013-04-04 MED ORDER — ACETAMINOPHEN 325 MG PO TABS
650.0000 mg | ORAL_TABLET | Freq: Once | ORAL | Status: AC
  Administered 2013-04-04: 650 mg via ORAL

## 2013-04-04 MED ORDER — SODIUM CHLORIDE 0.9 % IV SOLN
250.0000 mL | Freq: Once | INTRAVENOUS | Status: AC
  Administered 2013-04-04: 250 mL via INTRAVENOUS

## 2013-04-04 NOTE — Progress Notes (Signed)
Faxed lab results to dr. Lowell Guitar at fax [phone].

## 2013-04-04 NOTE — Patient Instructions (Addendum)
Blood Transfusion Information WHAT IS A BLOOD TRANSFUSION? A transfusion is the replacement of blood or some of its parts. Blood is made up of multiple cells which provide different functions.  Red blood cells carry oxygen and are used for blood loss replacement.  White blood cells fight against infection.  Platelets control bleeding.  Plasma helps clot blood.  Other blood products are available for specialized needs, such as hemophilia or other clotting disorders. BEFORE THE TRANSFUSION  Who gives blood for transfusions?   You may be able to donate blood to be used at a later date on yourself (autologous donation).  Relatives can be asked to donate blood. This is generally not any safer than if you have received blood from a stranger. The same precautions are taken to ensure safety when a relative's blood is donated.  Healthy volunteers who are fully evaluated to make sure their blood is safe. This is blood bank blood. Transfusion therapy is the safest it has ever been in the practice of medicine. Before blood is taken from a donor, a complete history is taken to make sure that person has no history of diseases nor engages in risky social behavior (examples are intravenous drug use or sexual activity with multiple partners). The donor's travel history is screened to minimize risk of transmitting infections, such as malaria. The donated blood is tested for signs of infectious diseases, such as HIV and hepatitis. The blood is then tested to be sure it is compatible with you in order to minimize the chance of a transfusion reaction. If you or a relative donates blood, this is often done in anticipation of surgery and is not appropriate for emergency situations. It takes many days to process the donated blood. RISKS AND COMPLICATIONS Although transfusion therapy is very safe and saves many lives, the main dangers of transfusion include:   Getting an infectious disease.  Developing a  transfusion reaction. This is an allergic reaction to something in the blood you were given. Every precaution is taken to prevent this. The decision to have a blood transfusion has been considered carefully by your caregiver before blood is given. Blood is not given unless the benefits outweigh the risks. AFTER THE TRANSFUSION  Right after receiving a blood transfusion, you will usually feel much better and more energetic. This is especially true if your red blood cells have gotten low (anemic). The transfusion raises the level of the red blood cells which carry oxygen, and this usually causes an energy increase.  The nurse administering the transfusion will monitor you carefully for complications. HOME CARE INSTRUCTIONS  No special instructions are needed after a transfusion. You may find your energy is better. Speak with your caregiver about any limitations on activity for underlying diseases you may have. SEEK MEDICAL CARE IF:   Your condition is not improving after your transfusion.  You develop redness or irritation at the intravenous (IV) site. SEEK IMMEDIATE MEDICAL CARE IF:  Any of the following symptoms occur over the next 12 hours:  Shaking chills.  You have a temperature by mouth above 102 F (38.9 C), not controlled by medicine.  Chest, back, or muscle pain.  People around you feel you are not acting correctly or are confused.  Shortness of breath or difficulty breathing.  Dizziness and fainting.  You get a rash or develop hives.  You have a decrease in urine output.  Your urine turns a dark color or changes to pink, red, or brown. Any of the following   symptoms occur over the next 10 days:  You have a temperature by mouth above 102 F (38.9 C), not controlled by medicine.  Shortness of breath.  Weakness after normal activity.  The white part of the eye turns yellow (jaundice).  You have a decrease in the amount of urine or are urinating less often.  Your  urine turns a dark color or changes to pink, red, or brown. Document Released: 10/07/2000 Document Revised: 01/02/2012 Document Reviewed: 05/26/2008 ExitCare Patient Information 2014 [employer].  

## 2013-04-05 ENCOUNTER — Telehealth: Payer: Self-pay | Admitting: Dietician

## 2013-04-05 LAB — TYPE AND SCREEN: ABO/RH(D): O POS

## 2013-04-05 NOTE — Telephone Encounter (Signed)
Brief Outpatient Oncology Nutrition Note  Patient has been identified to be at risk on malnutrition screen.  Wt Readings from Last 10 Encounters:  03/27/13 134 lb 14.4 oz (61.19 kg)  01/06/13 158 lb 8.2 oz (71.9 kg)  07/17/12 165 lb (74.844 kg)  07/03/12 165 lb (74.844 kg)   Patient with a 16% weight loss over the past 3 months.  Called and spoke with patient's wife who stated that patient is currently a resident at [facility].  Wife expressed concerns about patient.  She states that he has taste alterations and does not always receive what he has ordered and is therefore not eating well.    Called [facility] and spoke with an employee in the nutrition dept.  Patient is receiving Magic cup once daily and Med pass with a 2.0 calorie formula 120 ml bid.  They will send an Ambassador daily to assist patient with ordering.  Spoke with nurse and discussed the importance of mouth care to help with taste issues and they are to educate patient regarding this.  Further care deferred to RD at [facility].  Oran Rein, RD, LDN Clinical Inpatient Dietitian Pager:  [phone] Weekend and after hours pager:  [phone]

## 2013-04-08 ENCOUNTER — Telehealth: Payer: Self-pay | Admitting: *Deleted

## 2013-04-08 ENCOUNTER — Ambulatory Visit: Payer: Medicare Other

## 2013-04-08 ENCOUNTER — Other Ambulatory Visit (HOSPITAL_BASED_OUTPATIENT_CLINIC_OR_DEPARTMENT_OTHER): Payer: Medicare Other | Admitting: Lab

## 2013-04-08 DIAGNOSIS — C9200 Acute myeloblastic leukemia, not having achieved remission: Secondary | ICD-10-CM

## 2013-04-08 LAB — COMPREHENSIVE METABOLIC PANEL (CC13)
AST: 19 U/L (ref 5–34)
BUN: 11.3 mg/dL (ref 7.0–26.0)
CO2: 25 mEq/L (ref 22–29)
Calcium: 9.9 mg/dL (ref 8.4–10.4)
Chloride: 94 mEq/L — ABNORMAL LOW (ref 98–107)
Creatinine: 0.9 mg/dL (ref 0.7–1.3)
Total Bilirubin: 0.32 mg/dL (ref 0.20–1.20)

## 2013-04-08 LAB — CBC WITH DIFFERENTIAL/PLATELET
BASO%: 0.2 % (ref 0.0–2.0)
Basophils Absolute: 0 10*3/uL (ref 0.0–0.1)
Eosinophils Absolute: 0 10*3/uL (ref 0.0–0.5)
HCT: 34.4 % — ABNORMAL LOW (ref 38.4–49.9)
HGB: 11.9 g/dL — ABNORMAL LOW (ref 13.0–17.1)
MONO#: 1.8 10*3/uL — ABNORMAL HIGH (ref 0.1–0.9)
NEUT#: 8.9 10*3/uL — ABNORMAL HIGH (ref 1.5–6.5)
NEUT%: 68.7 % (ref 39.0–75.0)
WBC: 12.9 10*3/uL — ABNORMAL HIGH (ref 4.0–10.3)
lymph#: 2.2 10*3/uL (ref 0.9–3.3)

## 2013-04-08 LAB — PHOSPHORUS: Phosphorus: 3.5 mg/dL (ref 2.3–4.6)

## 2013-04-08 LAB — MAGNESIUM (CC13): Magnesium: 2.1 mg/dl (ref 1.5–2.5)

## 2013-04-08 LAB — URIC ACID (CC13): Uric Acid, Serum: 5.3 mg/dl (ref 2.6–7.4)

## 2013-04-08 NOTE — Progress Notes (Signed)
Pt's labs did not meet parameters for transfusion today.  Cameo, RN spoke with pt.

## 2013-04-08 NOTE — Telephone Encounter (Signed)
S/w pt and daughter in lobby.  Informed of lab results today and no need for transfusions.  Will fax results to [facility].  Dau reports they have appt at [facility] this week on Friday for labs and possible admission for further chemotherapy.  She asks for lab/transfusion appt be canceled here on this Thurs.  She will call to cancel appt on Monday if he is admitted to hospital.  She will also call for appt for Neulasta next Friday if needed by [facility].

## 2013-04-11 ENCOUNTER — Other Ambulatory Visit: Payer: Medicare Other | Admitting: Lab

## 2013-04-12 ENCOUNTER — Other Ambulatory Visit: Payer: Self-pay | Admitting: Oncology

## 2013-04-12 DIAGNOSIS — C9200 Acute myeloblastic leukemia, not having achieved remission: Secondary | ICD-10-CM

## 2013-04-12 NOTE — Progress Notes (Signed)
Daughter called to inform that patient was admitted to [facility] today for 2nd booster treatment of HIDAC and to cancel appointments scheduled for Monday (04/15/13).  Also,call from [place], Oncology Nurse Coordinator [phone]) request to add Neulasta injection to next friday's appointment (04/19/12); POF to schedulers; Dr. Gaylyn Rong aware.

## 2013-04-15 ENCOUNTER — Other Ambulatory Visit: Payer: Medicare Other | Admitting: Lab

## 2013-04-16 ENCOUNTER — Telehealth: Payer: Self-pay | Admitting: *Deleted

## 2013-04-16 NOTE — Telephone Encounter (Signed)
Daughter called to make sure pt is scheduled for Neulasta on Friday 6/27.  He is being d/c'd from [facility] tomorrow.  POF was sent to add on Injection appt for 6/27 along w/ lab and transfusion already scheduled.  Asked Daughter to have [facility] send [place] updated orders on d/c to include Neulasta.  She will request these orders be sent to [place].

## 2013-04-17 ENCOUNTER — Telehealth: Payer: Self-pay | Admitting: *Deleted

## 2013-04-17 ENCOUNTER — Encounter: Payer: Self-pay | Admitting: Oncology

## 2013-04-17 ENCOUNTER — Other Ambulatory Visit: Payer: Self-pay | Admitting: *Deleted

## 2013-04-17 DIAGNOSIS — C9200 Acute myeloblastic leukemia, not having achieved remission: Secondary | ICD-10-CM

## 2013-04-17 NOTE — Telephone Encounter (Signed)
Per staff message and POF I have scheduled appts.  Moved appt from 7/3 to 7/2 due to holiday  Scheduler notifiedJMW

## 2013-04-17 NOTE — Progress Notes (Signed)
Orders received from [facility].  See Care Coordination Note.

## 2013-04-18 ENCOUNTER — Non-Acute Institutional Stay (SKILLED_NURSING_FACILITY): Payer: Medicare Other | Admitting: Internal Medicine

## 2013-04-18 ENCOUNTER — Telehealth: Payer: Self-pay | Admitting: *Deleted

## 2013-04-18 DIAGNOSIS — C9200 Acute myeloblastic leukemia, not having achieved remission: Secondary | ICD-10-CM

## 2013-04-18 DIAGNOSIS — E871 Hypo-osmolality and hyponatremia: Secondary | ICD-10-CM

## 2013-04-18 DIAGNOSIS — D696 Thrombocytopenia, unspecified: Secondary | ICD-10-CM | POA: Insufficient documentation

## 2013-04-18 DIAGNOSIS — D630 Anemia in neoplastic disease: Secondary | ICD-10-CM

## 2013-04-18 NOTE — Telephone Encounter (Signed)
Call from daughter, asking why pt's lab/transfusion scheduled for next Wed 7/02 instead of Thurs 7/03?   Orders from [facility] request labs done on Mon and Thur.  Dau expresses concern that having labs on Wed will make pt wait too many days until labs are checked again on the following Monday. S/w  Marcelino Duster regarding scheduling.  She says "books are closed" in infusion room on 7/03 and that is why the appt was moved to 7/02, an of course we are closed on 7/04.   Left VM for Gladis Riffle to ask if any way the appt can be moved to 7/03.

## 2013-04-18 NOTE — Progress Notes (Signed)
Patient ID: Shawn Goodman, male   DOB: November 18, 1927, 78 y.o.   MRN: [id number]        HISTORY & PHYSICAL  DATE: 03/28/2013   FACILITY: [facility]  LEVEL OF CARE: SNF (31)  ALLERGIES:  Allergies  Allergen Reactions  . Other Other (See Comments)    Pineapple, caused problems with stomach    CHIEF COMPLAINT:  Manage acute myeloid leukemia, anemia, and thrombocytopenia.    HISTORY OF PRESENT ILLNESS:  The patient is an 79 year-old, Caucasian male.    ACUTE MYELOID LEUKEMIA:  This was diagnosed in March of this year.  Patient was hospitalized and received first course of consolidation chemotherapy and then readmitted back to the facility for short-term rehabilitation.  Patient is complaining of diarrhea, but denies any other problems.    ANEMIA: The anemia has been stable. The patient denies fatigue, melena or hematochezia. He is currently not on iron.  The anemia is thought to be secondary to acute myeloid leukemia and chemotherapy.  Last hemoglobin level was 9.5, MCV 93.1.     THROMBOCYTOPENIA:  This is thought to be secondary to chemotherapy and acute myeloid leukemia.  Last platelet count was less than 20.  Transfusion goal is to keep platelets greater than 10.  Therefore, he did not require transfusions.  He denies easy bruising or bleeding.    PAST MEDICAL HISTORY :  Past Medical History  Diagnosis Date  . AML (acute myeloblastic leukemia) 12/2012    t(1:14)  . Melanoma 11/2012    superficial  . Diverticulitis   . Basal cell carcinoma 2005  . AML (acute myeloblastic leukemia) 04/01/2013   Sigmoid diverticulosis.    Postural hypotension.    PAST SURGICAL HISTORY: Past Surgical History  Procedure Laterality Date  . Cataract extraction w/ intraocular lens  implant, bilateral    . Inguinal hernia repair  1953  . 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 Tonsillectomy and adenoidectomy  1939  . Melanoma removal      SOCIAL HISTORY:  reports that he has  quit smoking. His smoking use included Pipe and Cigars. He has never used smokeless tobacco. He reports that he does not drink alcohol or use illicit drugs. ALCOHOL:  He does drink alcohol.    FAMILY HISTORY:  Family History  Problem Relation Age of Onset  . Heart disease Father   . Multiple sclerosis Brother     CURRENT MEDICATIONS: Reviewed per MAR  REVIEW OF SYSTEMS:  See HPI otherwise 14 point ROS is negative.  PHYSICAL EXAMINATION  VS:  T 97.6       P 99      RR 16       BP 127/89      POX 99% room air       WT (Lb)  GENERAL: no acute distress, normal body habitus SKIN: warm & dry, no suspicious lesions or rashes, no excessive dryness EYES: conjunctivae normal, sclerae normal, normal eye lids MOUTH/THROAT: lips without lesions,no lesions in the mouth,tongue is without lesions,uvula elevates in midline NECK: supple, trachea midline, no neck masses, no thyroid tenderness, no thyromegaly LYMPHATICS: no LAN in the neck, no supraclavicular LAN RESPIRATORY: breathing is even & unlabored, BS CTAB CARDIAC: RRR, no murmur,no extra heart sounds, no edema GI:  ABDOMEN: abdomen soft, normal BS, no masses, no tenderness  LIVER/SPLEEN: no hepatomegaly, no splenomegaly MUSCULOSKELETAL: HEAD: normal to inspection & palpation BACK: no kyphosis, scoliosis or spinal processes tenderness  EXTREMITIES: LEFT UPPER EXTREMITY: full range of motion, normal strength & tone RIGHT UPPER EXTREMITY:  full range of motion, normal strength & tone LEFT LOWER EXTREMITY:  full range of motion, normal strength & tone RIGHT LOWER EXTREMITY:  full range of motion, normal strength & tone PSYCHIATRIC: the patient is alert & oriented to person, affect & behavior appropriate  LABS/RADIOLOGY: WBC 14.3, hemoglobin 9.5, MCV 93.1, platelets less than 20, ANC 11.8.    ASSESSMENT/PLAN:  Acute myeloid leukemia.  Continue chemotherapy per oncologist.    Anemia of neoplasm.  Transfusion goal is to keep greater than  8.  Therefore, he is stable.    Thrombocytopenia.  Transfusion goal is to keep platelets greater than 10.    Leukocytosis.  Patient asymptomatic.  We will monitor.  He is  currently on Levaquin.    Diarrhea.  New problem.  Likely a  side effect of chemotherapy.  Use p.r.n. Imodium for now.    I have reviewed patient's medical records received at admission/from hospitalization.  CPT CODE: 16109

## 2013-04-19 ENCOUNTER — Ambulatory Visit (HOSPITAL_BASED_OUTPATIENT_CLINIC_OR_DEPARTMENT_OTHER): Payer: Medicare Other

## 2013-04-19 ENCOUNTER — Other Ambulatory Visit (HOSPITAL_BASED_OUTPATIENT_CLINIC_OR_DEPARTMENT_OTHER): Payer: Medicare Other | Admitting: Lab

## 2013-04-19 ENCOUNTER — Ambulatory Visit: Payer: Medicare Other

## 2013-04-19 ENCOUNTER — Telehealth: Payer: Self-pay | Admitting: *Deleted

## 2013-04-19 ENCOUNTER — Other Ambulatory Visit: Payer: Self-pay | Admitting: Oncology

## 2013-04-19 VITALS — BP 103/56 | HR 98 | Temp 97.0°F

## 2013-04-19 DIAGNOSIS — C9201 Acute myeloblastic leukemia, in remission: Secondary | ICD-10-CM

## 2013-04-19 DIAGNOSIS — D709 Neutropenia, unspecified: Secondary | ICD-10-CM

## 2013-04-19 DIAGNOSIS — C9200 Acute myeloblastic leukemia, not having achieved remission: Secondary | ICD-10-CM

## 2013-04-19 LAB — CBC WITH DIFFERENTIAL/PLATELET
BASO%: 0.8 % (ref 0.0–2.0)
Eosinophils Absolute: 0 10*3/uL (ref 0.0–0.5)
HCT: 38.6 % (ref 38.4–49.9)
LYMPH%: 11.7 % — ABNORMAL LOW (ref 14.0–49.0)
MCHC: 34.2 g/dL (ref 32.0–36.0)
MONO#: 0.2 10*3/uL (ref 0.1–0.9)
NEUT%: 84 % — ABNORMAL HIGH (ref 39.0–75.0)
Platelets: 91 10*3/uL — ABNORMAL LOW (ref 140–400)
WBC: 6 10*3/uL (ref 4.0–10.3)

## 2013-04-19 MED ORDER — PEGFILGRASTIM INJECTION 6 MG/0.6ML
6.0000 mg | Freq: Once | SUBCUTANEOUS | Status: AC
  Administered 2013-04-19: 6 mg via SUBCUTANEOUS
  Filled 2013-04-19: qty 0.6

## 2013-04-19 NOTE — Progress Notes (Signed)
Patient didn't need a blood transfusion today. Hemoglobin was 13.2. Patient and daughter verbalized understanding. The injection nurse gave him a Neulasta shot.

## 2013-04-19 NOTE — Telephone Encounter (Signed)
Daughter Shawn Goodman called reporting he will not make it at 10:00 am due to low blood pressure.  The staff at [facility] are working with him putting his legs up and he is not rebounding fast enough.  Shawn Goodman reports "he received blood transfusion at [facility] last week and his pltc = 177 K so I don't think he needs labs or any transfusion today.  What do I need to do about the neulasta injection he is to receive?"  Asked that they get B/P under control first and call us to reschedule.  Per Shawn Goodman "this low b/p bottoming out is standard for him.  I'll try to get him there to receive the injection at 11:30".  Will notify staff patient may be late.    Need to know if he should only receive injection or do labs need to be rescheduled.

## 2013-04-22 ENCOUNTER — Telehealth: Payer: Self-pay | Admitting: Oncology

## 2013-04-22 ENCOUNTER — Telehealth: Payer: Self-pay | Admitting: *Deleted

## 2013-04-22 ENCOUNTER — Ambulatory Visit (HOSPITAL_BASED_OUTPATIENT_CLINIC_OR_DEPARTMENT_OTHER): Payer: Medicare Other | Admitting: Oncology

## 2013-04-22 ENCOUNTER — Ambulatory Visit (HOSPITAL_BASED_OUTPATIENT_CLINIC_OR_DEPARTMENT_OTHER): Payer: Medicare Other

## 2013-04-22 ENCOUNTER — Other Ambulatory Visit (HOSPITAL_BASED_OUTPATIENT_CLINIC_OR_DEPARTMENT_OTHER): Payer: Medicare Other

## 2013-04-22 VITALS — BP 122/68 | HR 82 | Temp 98.4°F | Resp 18

## 2013-04-22 VITALS — BP 89/85 | HR 103 | Temp 97.0°F | Resp 16 | Ht 71.0 in | Wt 131.9 lb

## 2013-04-22 DIAGNOSIS — C9200 Acute myeloblastic leukemia, not having achieved remission: Secondary | ICD-10-CM

## 2013-04-22 LAB — CBC WITH DIFFERENTIAL/PLATELET
Basophils Absolute: 0 10*3/uL (ref 0.0–0.1)
EOS%: 0.4 % (ref 0.0–7.0)
HCT: 36.4 % — ABNORMAL LOW (ref 38.4–49.9)
HGB: 12.5 g/dL — ABNORMAL LOW (ref 13.0–17.1)
LYMPH%: 29.2 % (ref 14.0–49.0)
MCH: 31.3 pg (ref 27.2–33.4)
MCV: 91 fL (ref 79.3–98.0)
NEUT%: 68.6 % (ref 39.0–75.0)
Platelets: 5 10*3/uL — CL (ref 140–400)
lymph#: 0.8 10*3/uL — ABNORMAL LOW (ref 0.9–3.3)

## 2013-04-22 LAB — COMPREHENSIVE METABOLIC PANEL (CC13)
Albumin: 3.1 g/dL — ABNORMAL LOW (ref 3.5–5.0)
Alkaline Phosphatase: 78 U/L (ref 40–150)
BUN: 14.4 mg/dL (ref 7.0–26.0)
Calcium: 9.3 mg/dL (ref 8.4–10.4)
Glucose: 174 mg/dl — ABNORMAL HIGH (ref 70–140)
Potassium: 4.2 mEq/L (ref 3.5–5.1)

## 2013-04-22 LAB — MAGNESIUM (CC13): Magnesium: 1.9 mg/dl (ref 1.5–2.5)

## 2013-04-22 MED ORDER — HEPARIN SOD (PORK) LOCK FLUSH 100 UNIT/ML IV SOLN
500.0000 [IU] | Freq: Every day | INTRAVENOUS | Status: AC | PRN
  Administered 2013-04-22: 500 [IU]
  Filled 2013-04-22: qty 5

## 2013-04-22 MED ORDER — ACETAMINOPHEN 325 MG PO TABS
650.0000 mg | ORAL_TABLET | Freq: Once | ORAL | Status: AC
  Administered 2013-04-22: 650 mg via ORAL

## 2013-04-22 MED ORDER — SODIUM CHLORIDE 0.9 % IV SOLN: 250.0000 mL | Freq: Once | INTRAVENOUS | Status: DC

## 2013-04-22 MED ORDER — SODIUM CHLORIDE 0.9 % IJ SOLN
10.0000 mL | INTRAMUSCULAR | Status: AC | PRN
  Administered 2013-04-22: 10 mL
  Filled 2013-04-22: qty 10

## 2013-04-22 MED ORDER — SODIUM CHLORIDE 0.9 % IV SOLN
250.0000 mL | Freq: Once | INTRAVENOUS | Status: AC
  Administered 2013-04-22: 250 mL via INTRAVENOUS

## 2013-04-22 MED ORDER — DIPHENHYDRAMINE HCL 25 MG PO CAPS
25.0000 mg | ORAL_CAPSULE | Freq: Once | ORAL | Status: AC
  Administered 2013-04-22: 25 mg via ORAL

## 2013-04-22 NOTE — Telephone Encounter (Signed)
Informed dau that Platelets are in blood bank now and per Charge RN in infusion, pt can return at 4 pm for Platelet transfusion.  She verbalized understanding and will bring pt back.

## 2013-04-22 NOTE — Progress Notes (Signed)
[facility]  [phone] [phone]     INITIAL HEMATOLOGY CONSULTATION    Referral MD:  Miachel Roux, M.D.  DIAGNOSIS:  AML    PAST THERAPY:  chemoinduction with CTSU [id number] protocol with 7+3 at [facility].  CURRENT THERAPY:  Consolidation with 2 cycles of HDAraC at [facility] followed by randomization to observation vs. Decitabine    INTERVAL HISTORY:  Shawn Goodman returned to clinic today with his daughter.  He received 2nd cycle of HDAraC last week.  He has been feeling week, dizzy, orthostatic hypotension, no appetite, weight loss.  He felt exactly the same way after the first HDAraC which required about two weeks to recover.  He denied fever, mucositis, SOB, chest pain, cough, abd pain, bleeding, skin rash.     Past Medical History  Diagnosis Date  . AML (acute myeloblastic leukemia) 12/2012    t(1:14)  . Melanoma 11/2012    superficial  . Diverticulitis   . Basal cell carcinoma 2005  . AML (acute myeloblastic leukemia) 04/01/2013  :    Past Surgical History  Procedure Laterality Date  . Cataract extraction w/ intraocular lens  implant, bilateral    . Inguinal hernia repair  1953  . 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 Tonsillectomy and adenoidectomy  1939  . Melanoma removal    :   CURRENT MEDS: Current Outpatient Prescriptions  Medication Sig Dispense Refill  . acyclovir (ZOVIRAX) 400 MG tablet Take 400 mg by mouth 2 (two) times daily. Take 1 tablet by mouth twice daily while neutropenic. Your oncologist will tell you when to stop taking this medication.      . fluconazole (DIFLUCAN) 200 MG tablet Take 200 mg by mouth daily. Take 1 tablet by mouth daily while neutropenic. Your oncologist will tell you when to stop taking this medication.      Marland Kitchen levofloxacin (LEVAQUIN) 750 MG tablet Take 750 mg by mouth daily. Take 1 tablet (750 mg total) by mouth daily. While neutropenic. Your oncologist will tell you when to start  and stop taking this medication.      . Multiple Vitamin (MULTI-VITAMINS) TABS Take 1 tablet by mouth daily. Take 1 tablet by mouth daily.      . ondansetron (ZOFRAN) 8 MG tablet Take 8 mg by mouth every 8 (eight) hours as needed. Take 1 tablet (8 mg total) by mouth every 8 (eight) hours as needed for Nausea.      . polycarbophil (FIBERCON) 625 MG tablet Take 1,250 mg by mouth 3 (three) times daily as needed. Take 2 tablets (1,250 mg total) by mouth 3 (three) times daily as needed (For diarrhea/loose stools).      . prochlorperazine (COMPAZINE) 5 MG tablet Take 5 mg by mouth every 6 (six) hours as needed. Take 1 tablet (5 mg total) by mouth every 6 (six) hours as needed.       No current facility-administered medications for this visit.      Allergies  Allergen Reactions  . Other Other (See Comments)    Pineapple, caused problems with stomach  . Tape Rash    IV Opsite  :  Family History  Problem Relation Age of Onset  . Heart disease Father   . Multiple sclerosis Brother   :  History   Social History  . Marital Status: Married    Spouse Name: N/A    Number of Children: 1  . Years of Education: N/A  Occupational History  .      retired Automotive engineer; Merchandiser, retail, insurance, finace   Social History Main Topics  . Smoking status: Former Smoker    Types: Pipe, Software engineer  . Smokeless tobacco: Never Used  . Alcohol Use: No     Comment: rarely wine  . Drug Use: No  . Sexually Active: Not on file   Other Topics Concern  . Not on file   Social History Narrative  . No narrative on file  :  REVIEW OF SYSTEM:  The rest of the 14-point review of sytem was negative.   Exam: ECOG 2-3  General:  Thin appearing man, in no acute distress.  Eyes:  no scleral icterus.  ENT:  There were no oropharyngeal lesions.  Neck was without thyromegaly.  Lymphatics:  Negative cervical, supraclavicular or axillary adenopathy.  Respiratory: lungs were clear bilaterally without wheezing or  crackles.  Cardiovascular:  Regular rate and rhythm, S1/S2, without murmur, rub or gallop.  There was no pedal edema.  GI:  abdomen was soft, flat, nontender, nondistended, without organomegaly.  Muscoloskeletal:  no spinal tenderness of palpation of vertebral spine.  Skin exam was without echymosis, petichae.   Attention was good.   Language was appropriate.  Mood was normal without depression.  Speech was not pressured.  Thought content was not tangential.    LABS:  Lab Results  Component Value Date   WBC 2.7* 04/22/2013   HGB 12.5* 04/22/2013   HCT 36.4* 04/22/2013   PLT 5* 04/22/2013   GLUCOSE 174* 04/22/2013   ALT 11 04/22/2013   AST 10 04/22/2013   NA 132* 04/22/2013   K 4.2 04/22/2013   CL 94* 04/08/2013   CREATININE 0.8 04/22/2013   BUN 14.4 04/22/2013   CO2 27 04/22/2013     ASSESSMENT AND PLAN:   1.  Diagnosis:  Acute myeloid leukemia (AML). 2.  Status:  In remission.  S/p two dose of high dose cytarabine consolidation. 3.  Follow up:     * Lab and possible transfusion every Monday and Thursday.   * Goal of transfusion for Hgb <9 or Platelet <20 with irradiated blood products.  His platelet today was 5; therefore, he will be arranged for platelet transfusion this week.   * Return visit with [facility] around 05/10/13 for restaging bone marrow biopsy.  Return visit with the [facility] late July 2014.    I informed Shawn Goodman and his daughter that I am leaving the practice.  The [facility] will arrange for him to see another provider when he returns.     The length of time of the face-to-face encounter was 10 minutes. More than 50% of time was spent counseling and coordination of care.

## 2013-04-22 NOTE — Patient Instructions (Signed)
Platelet Transfusion Information This is information about transfusions of platelets. Platelets are tiny cells made by the bone marrow and found in the blood. When a blood vessel is damaged platelets rush to the damaged area to help form a clot. This begins the healing process. When platelets get very low your blood may have trouble clotting. This may be from:  Illness.  Blood disorder.  Chemotherapy to treat cancer. Often lower platelet counts do not usually cause problems.  Platelets usually last for 7 to 10 days. If they are not used not used in an injury, they are broken down by the liver or spleen. Symptoms of low platelet count include:  Nosebleeds.  Bleeding gums.  Heavy periods.  Bruising and tiny blood spots in the skin.  Pin point spots of bleeding are called (petechiae).  Larger bruises (purpura).  Bleeding can be more serious if it happens in the brain or bowel. Platelet transfusions are often used to keep the platelet count at an acceptable level. Serious bleeding due to low platelets is uncommon. RISKS AND COMPLICATIONS Severe side effects from platelet transfusions are uncommon. Minor reactions may include:  Itching.  Rashes.  High temperature and shivering. Medications are available to stop transfusion reactions. Let your caregivers know if you develop any of the above problems.  If you are having platelet transfusions frequently they may get less effective. This is called becoming refractory to platelets. It is uncommon. This can happen from non-immune causes and immune causes. Non-immune causes include:  High temperatures.  Some medications.  An enlarged spleen. Immune causes happen when your body discovers the platelets are not your own and begin making antibodies against them. The antibodies kill the platelets quickly. Even with platelet transfusions you may still notice problems with bleeding or bruising. Let your caregivers know about this. Other things  can be done to help if this happens.  BEFORE THE PROCEDURE   Your doctors will check your platelet count regularly.  If the platelet count is too low it may be necessary to have a platelet transfusion.  This is more important before certain procedures with a risk of bleeding such as a spinal tap.  Platelet transfusion reduces the risk of bleeding during or after the procedure.  Except in emergencies, giving a transfusion requires a written consent. Before blood is taken from a donor, a complete history is taken to make sure the person has no history of previous diseases, nor engages in risky social behavior. Examples of this are intravenous drug use or sexual activity with multiple partners. This could lead to infected blood or blood products being used. This history is done even in spite of the extensive testing to make sure the blood is safe. All blood products transfused are tested to make sure it is a match for the person getting the blood. It is also checked for infections. Blood is the safest it has ever been. The risk of getting an infection is very low. PROCEDURE  The platelets are stored in small plastic bags which are kept at a low temperature.  Each bag is called a unit and sometimes two units are given. They are given through an intravenous line by drip infusion over about one half hour.  Usually blood is collected from multiple people to get enough to transfuse.  Sometimes, the platelets are collected from a single person. This is done using a special machine that separates the platelets from the blood. The machine is called an apheresis machine. Platelets collected   in this way are called apheresed platelets. Apheresed platelets reduce the risk of becoming sensitive to the platelets. This lowers the chances of having a transfusion reaction.  As it only takes a short time to give the platelets, this treatment can be given in an outpatients department. Platelets can also be given  before or after other treatments. SEEK IMMEDIATE MEDICAL CARE IF: Any of the following symptoms over the next 12 hours or several days:  Shaking chills.  Fever with a temperature greater than 102 F (38.9 C) develops.  Back pain or muscle pain.  People around you feel you are not acting correctly, or you are confused.  Blood in the urine or bowel movements or bleeding from any place in your body.  Shortness of breath, or difficulty breathing.  Dizziness.  Fainting.  You break out in a rash or develop hives.  You have a decrease in the amount of urine you are putting out, or the urine turns a dark color or changes to pink, red, or brown.  A severe headache or stiff neck.  Bruising more easily. Document Released: 08/07/2007 Document Revised: 01/02/2012 Document Reviewed: 08/07/2007 ExitCare Patient Information 2014 [employer].  

## 2013-04-22 NOTE — Patient Instructions (Signed)
1.  Acute myeloid leukemia. 2.  Status:  2nd cycle of maintenance chemo last week. 3.  Follow up:  With [facility] leukemia clinic on 05/10/13.  Keep appointments for blood check here at the Cancer every Mondays and Thursdays unless he is admitted at [facility] for chemo.

## 2013-04-22 NOTE — Progress Notes (Signed)
No Platelets available until this afternoon per Blood Bank. They are sending some over by courier from [place].  Informed dau that we will call her when Platelets are available to come back to [facility] for transfusion.  Checked w/ Consulting civil engineer in infusion and there is no place for pt to wait in Infusion room for the Platelets. She will take pt back to [facility] and wait for phone call.

## 2013-04-23 LAB — PREPARE PLATELET PHERESIS: Unit division: 0

## 2013-04-24 ENCOUNTER — Other Ambulatory Visit: Payer: Medicare Other | Admitting: Lab

## 2013-04-24 ENCOUNTER — Encounter (HOSPITAL_COMMUNITY)
Admission: RE | Admit: 2013-04-24 | Discharge: 2013-04-24 | Disposition: A | Discharge status: Home / Self Care | Payer: Medicare Other | Source: Ambulatory Visit | Attending: Oncology | Admitting: Oncology

## 2013-04-24 DIAGNOSIS — D649 Anemia, unspecified: Secondary | ICD-10-CM | POA: Insufficient documentation

## 2013-04-24 DIAGNOSIS — C9200 Acute myeloblastic leukemia, not having achieved remission: Secondary | ICD-10-CM | POA: Insufficient documentation

## 2013-04-25 ENCOUNTER — Other Ambulatory Visit (HOSPITAL_BASED_OUTPATIENT_CLINIC_OR_DEPARTMENT_OTHER): Payer: Medicare Other | Admitting: Lab

## 2013-04-25 ENCOUNTER — Other Ambulatory Visit: Payer: Medicare Other | Admitting: Lab

## 2013-04-25 ENCOUNTER — Ambulatory Visit (HOSPITAL_BASED_OUTPATIENT_CLINIC_OR_DEPARTMENT_OTHER): Payer: Medicare Other

## 2013-04-25 ENCOUNTER — Other Ambulatory Visit: Payer: Self-pay | Admitting: *Deleted

## 2013-04-25 ENCOUNTER — Telehealth: Payer: Self-pay | Admitting: *Deleted

## 2013-04-25 VITALS — BP 127/73 | HR 95 | Temp 97.9°F | Resp 18

## 2013-04-25 DIAGNOSIS — C9200 Acute myeloblastic leukemia, not having achieved remission: Secondary | ICD-10-CM

## 2013-04-25 LAB — MANUAL DIFFERENTIAL
Basophil: 0 % (ref 0–2)
EOS: 0 % (ref 0–7)
LYMPH: 88 % — ABNORMAL HIGH (ref 14–49)
MONO: 10 % (ref 0–14)
Metamyelocytes: 0 % (ref 0–0)
Smudge Cells: 0

## 2013-04-25 LAB — CBC WITH DIFFERENTIAL/PLATELET
MCH: 30.8 pg (ref 27.2–33.4)
MCHC: 35 g/dL (ref 32.0–36.0)
MCV: 88 fL (ref 79.3–98.0)
Platelets: 4 10*3/uL — CL (ref 140–400)
RBC: 3.25 10*6/uL — ABNORMAL LOW (ref 4.20–5.82)

## 2013-04-25 MED ORDER — DIPHENHYDRAMINE HCL 25 MG PO CAPS
25.0000 mg | ORAL_CAPSULE | Freq: Once | ORAL | Status: AC
  Administered 2013-04-25: 25 mg via ORAL

## 2013-04-25 MED ORDER — ACETAMINOPHEN 325 MG PO TABS
650.0000 mg | ORAL_TABLET | Freq: Once | ORAL | Status: AC
  Administered 2013-04-25: 650 mg via ORAL

## 2013-04-25 NOTE — Telephone Encounter (Signed)
Informed Triage RN at [facility] of pt's low WBC and ANC,  Along w/ Platelet count.  Faxed CBC to #[phone].  Informed her daughter states pt is supposed to have LDH, Uric Acid and Phos added on to labs on Mondays.  Asked for updated orders to include these labs.  Informed pt is waiting on Platelet transfusion here today.  Platelets arriving by courier from [facility] per Blood Bank.  Copy of CBC was given to daughter.

## 2013-04-25 NOTE — Telephone Encounter (Signed)
Received orders from [facility] to add on Mag, Phos and Uric Acid to labs on Mondays.  Orders entered.

## 2013-04-26 LAB — PREPARE PLATELET PHERESIS

## 2013-04-29 ENCOUNTER — Other Ambulatory Visit (HOSPITAL_BASED_OUTPATIENT_CLINIC_OR_DEPARTMENT_OTHER): Payer: Medicare Other | Admitting: Lab

## 2013-04-29 DIAGNOSIS — C9200 Acute myeloblastic leukemia, not having achieved remission: Secondary | ICD-10-CM

## 2013-04-29 LAB — CBC WITH DIFFERENTIAL/PLATELET
BASO%: 0.8 % (ref 0.0–2.0)
EOS%: 0.1 % (ref 0.0–7.0)
HCT: 31 % — ABNORMAL LOW (ref 38.4–49.9)
MCH: 31.6 pg (ref 27.2–33.4)
MCHC: 34.8 g/dL (ref 32.0–36.0)
MONO#: 5.6 10*3/uL — ABNORMAL HIGH (ref 0.1–0.9)
NEUT%: 62.9 % (ref 39.0–75.0)
RDW: 17.2 % — ABNORMAL HIGH (ref 11.0–14.6)
WBC: 24.6 10*3/uL — ABNORMAL HIGH (ref 4.0–10.3)
lymph#: 3.3 10*3/uL (ref 0.9–3.3)

## 2013-04-29 LAB — COMPREHENSIVE METABOLIC PANEL (CC13)
AST: 29 U/L (ref 5–34)
Albumin: 2.8 g/dL — ABNORMAL LOW (ref 3.5–5.0)
Alkaline Phosphatase: 109 U/L (ref 40–150)
BUN: 11.6 mg/dL (ref 7.0–26.0)
Potassium: 4.6 mEq/L (ref 3.5–5.1)
Sodium: 124 mEq/L — ABNORMAL LOW (ref 136–145)
Total Bilirubin: 0.36 mg/dL (ref 0.20–1.20)
Total Protein: 5.7 g/dL — ABNORMAL LOW (ref 6.4–8.3)

## 2013-04-29 LAB — URIC ACID (CC13): Uric Acid, Serum: 5.2 mg/dl (ref 2.6–7.4)

## 2013-04-29 LAB — HOLD TUBE, BLOOD BANK

## 2013-05-02 ENCOUNTER — Other Ambulatory Visit (HOSPITAL_BASED_OUTPATIENT_CLINIC_OR_DEPARTMENT_OTHER): Payer: Medicare Other | Admitting: Lab

## 2013-05-02 ENCOUNTER — Ambulatory Visit: Payer: Medicare Other

## 2013-05-02 DIAGNOSIS — C9200 Acute myeloblastic leukemia, not having achieved remission: Secondary | ICD-10-CM

## 2013-05-02 LAB — CBC WITH DIFFERENTIAL/PLATELET
Basophils Absolute: 0.1 10*3/uL (ref 0.0–0.1)
EOS%: 0.1 % (ref 0.0–7.0)
Eosinophils Absolute: 0 10*3/uL (ref 0.0–0.5)
HCT: 32.3 % — ABNORMAL LOW (ref 38.4–49.9)
HGB: 11.2 g/dL — ABNORMAL LOW (ref 13.0–17.1)
MCH: 30.5 pg (ref 27.2–33.4)
NEUT#: 26.1 10*3/uL — ABNORMAL HIGH (ref 1.5–6.5)
NEUT%: 70.7 % (ref 39.0–75.0)
lymph#: 5 10*3/uL — ABNORMAL HIGH (ref 0.9–3.3)

## 2013-05-02 NOTE — Progress Notes (Signed)
Blood counts good, no need for transfusion.

## 2013-05-06 ENCOUNTER — Ambulatory Visit: Payer: Medicare Other

## 2013-05-06 ENCOUNTER — Other Ambulatory Visit (HOSPITAL_BASED_OUTPATIENT_CLINIC_OR_DEPARTMENT_OTHER): Payer: Medicare Other | Admitting: Lab

## 2013-05-06 DIAGNOSIS — C9200 Acute myeloblastic leukemia, not having achieved remission: Secondary | ICD-10-CM

## 2013-05-06 LAB — COMPREHENSIVE METABOLIC PANEL (CC13)
ALT: 27 U/L (ref 0–55)
AST: 23 U/L (ref 5–34)
Albumin: 3.2 g/dL — ABNORMAL LOW (ref 3.5–5.0)
BUN: 12.9 mg/dL (ref 7.0–26.0)
Calcium: 9.2 mg/dL (ref 8.4–10.4)
Chloride: 94 mEq/L — ABNORMAL LOW (ref 98–109)
Potassium: 4.3 mEq/L (ref 3.5–5.1)
Sodium: 129 mEq/L — ABNORMAL LOW (ref 136–145)
Total Protein: 6.3 g/dL — ABNORMAL LOW (ref 6.4–8.3)

## 2013-05-06 LAB — CBC WITH DIFFERENTIAL/PLATELET
BASO%: 0.3 % (ref 0.0–2.0)
EOS%: 0 % (ref 0.0–7.0)
Eosinophils Absolute: 0 10*3/uL (ref 0.0–0.5)
MCV: 91.3 fL (ref 79.3–98.0)
MONO%: 12 % (ref 0.0–14.0)
NEUT#: 17.4 10*3/uL — ABNORMAL HIGH (ref 1.5–6.5)
RBC: 3.68 10*6/uL — ABNORMAL LOW (ref 4.20–5.82)
RDW: 18.1 % — ABNORMAL HIGH (ref 11.0–14.6)
nRBC: 0 % (ref 0–0)

## 2013-05-06 LAB — PHOSPHORUS: Phosphorus: 3.7 mg/dL (ref 2.3–4.6)

## 2013-05-06 LAB — HOLD TUBE, BLOOD BANK

## 2013-05-06 NOTE — Progress Notes (Signed)
Pt did not require transfusion per parameters.  Pt notified.  Requested lab results faxed to [place] Dec at [place] - done.  TKF

## 2013-05-07 ENCOUNTER — Telehealth: Payer: Self-pay | Admitting: *Deleted

## 2013-05-07 NOTE — Telephone Encounter (Signed)
Patient daughter called to cancel appt for this week on 05/09/13. States that counts have been checked by [facility] and has recovered and will be going back for BMBX on Friday and have counts checked then to make future appointments. Will cancel these appts.

## 2013-05-09 ENCOUNTER — Other Ambulatory Visit: Payer: Medicare Other | Admitting: Lab

## 2013-05-13 ENCOUNTER — Non-Acute Institutional Stay (SKILLED_NURSING_FACILITY): Payer: Medicare Other | Admitting: Internal Medicine

## 2013-05-13 ENCOUNTER — Other Ambulatory Visit: Payer: Medicare Other | Admitting: Lab

## 2013-05-13 DIAGNOSIS — R197 Diarrhea, unspecified: Secondary | ICD-10-CM

## 2013-05-13 DIAGNOSIS — D696 Thrombocytopenia, unspecified: Secondary | ICD-10-CM

## 2013-05-13 DIAGNOSIS — C9200 Acute myeloblastic leukemia, not having achieved remission: Secondary | ICD-10-CM

## 2013-05-13 DIAGNOSIS — D630 Anemia in neoplastic disease: Secondary | ICD-10-CM

## 2013-05-14 ENCOUNTER — Telehealth: Payer: Self-pay | Admitting: Oncology

## 2013-05-14 NOTE — Telephone Encounter (Signed)
pt called to cancel lab and blood this week ...had done at [facility].

## 2013-05-16 ENCOUNTER — Other Ambulatory Visit: Payer: Medicare Other | Admitting: Lab

## 2013-05-16 ENCOUNTER — Other Ambulatory Visit: Payer: Self-pay | Admitting: *Deleted

## 2013-05-16 DIAGNOSIS — R197 Diarrhea, unspecified: Secondary | ICD-10-CM | POA: Insufficient documentation

## 2013-05-16 NOTE — Progress Notes (Signed)
PROGRESS NOTE  DATE: 05/13/2013  FACILITY: Nursing Home Location: [facility]  LEVEL OF CARE: SNF (31)  Routine Visit  CHIEF COMPLAINT:  Manage acute myeloid leukemia, anemia of neoplasm and thrombocytopenia  HISTORY OF PRESENT ILLNESS:  REASSESSMENT OF ONGOING PROBLEM(S):  AML: Patient was diagnosed in 3/14. He is receiving treatment through [facility]. He denies any acute problems.  ANEMIA: The anemia has been stable. The patient denies fatigue, melena or hematochezia. No complications from the medications currently being used. In 7/14 hemoglobin 11.5, MCV 71.  THROMBOCYTOPENIA: In 7/14 platelet trend 28, 114, 245. Patient denies easy bruising or bleeding.  PAST MEDICAL HISTORY : Reviewed.  No changes.  CURRENT MEDICATIONS: Reviewed per [facility]  REVIEW OF SYSTEMS:  GENERAL: no change in appetite, no fatigue, no weight changes, no fever, chills or weakness RESPIRATORY: no cough, SOB, DOE, wheezing, hemoptysis CARDIAC: no chest pain, edema or palpitations GI: no abdominal pain, diarrhea, constipation, heart burn, nausea or vomiting  PHYSICAL EXAMINATION  VS:  T 99.1       P 71     RR 19     BP 139/70     POX % 97     WT (Lb)  GENERAL: no acute distress, normal body habitus EYES: conjunctivae normal, sclerae normal, normal eye lids NECK: supple, trachea midline, no neck masses, no thyroid tenderness, no thyromegaly LYMPHATICS: no LAN in the neck, no supraclavicular LAN RESPIRATORY: breathing is even & unlabored, BS CTAB CARDIAC: RRR, no murmur,no extra heart sounds, no edema GI: abdomen soft, normal BS, no masses, no tenderness, no hepatomegaly, no splenomegaly PSYCHIATRIC: the patient is alert & oriented to person, affect & behavior appropriate  LABS/RADIOLOGY:  7/14 WBC 23.1, hemoglobin 11.5, MCV 91.3, platelets 245, sodium 125, chloride 93, glucose 260, total protein 6.3, albumin is 3.1 otherwise CMP normal, phosphorus 3.7, magnesium 2.1, LDH 200,  uric acid 4.1  ASSESSMENT/PLAN:  AML- management per oncology. Anemia of neoplasm-stable. Thrombocytopenia -- last platelet count normal. Diarrhea-on FiberCon.  CPT CODE: 40981

## 2013-05-17 ENCOUNTER — Other Ambulatory Visit: Payer: Self-pay | Admitting: *Deleted

## 2013-05-17 DIAGNOSIS — C9200 Acute myeloblastic leukemia, not having achieved remission: Secondary | ICD-10-CM

## 2013-05-20 ENCOUNTER — Ambulatory Visit (HOSPITAL_BASED_OUTPATIENT_CLINIC_OR_DEPARTMENT_OTHER): Payer: Medicare Other | Admitting: Hematology and Oncology

## 2013-05-20 ENCOUNTER — Other Ambulatory Visit (HOSPITAL_BASED_OUTPATIENT_CLINIC_OR_DEPARTMENT_OTHER): Payer: Medicare Other | Admitting: Lab

## 2013-05-20 VITALS — BP 87/59 | HR 101 | Temp 96.8°F | Resp 18 | Ht 71.0 in

## 2013-05-20 DIAGNOSIS — C9200 Acute myeloblastic leukemia, not having achieved remission: Secondary | ICD-10-CM

## 2013-05-20 LAB — COMPREHENSIVE METABOLIC PANEL (CC13)
AST: 28 U/L (ref 5–34)
Alkaline Phosphatase: 73 U/L (ref 40–150)
BUN: 6.2 mg/dL — ABNORMAL LOW (ref 7.0–26.0)
Creatinine: 0.8 mg/dL (ref 0.7–1.3)
Glucose: 196 mg/dl — ABNORMAL HIGH (ref 70–140)
Potassium: 4.1 mEq/L (ref 3.5–5.1)
Total Bilirubin: 0.55 mg/dL (ref 0.20–1.20)

## 2013-05-20 LAB — CBC WITH DIFFERENTIAL/PLATELET
BASO%: 1.3 % (ref 0.0–2.0)
EOS%: 2.8 % (ref 0.0–7.0)
HCT: 36 % — ABNORMAL LOW (ref 38.4–49.9)
LYMPH%: 19 % (ref 14.0–49.0)
MCH: 33.8 pg — ABNORMAL HIGH (ref 27.2–33.4)
MCHC: 34.3 g/dL (ref 32.0–36.0)
MCV: 98.5 fL — ABNORMAL HIGH (ref 79.3–98.0)
MONO%: 8.7 % (ref 0.0–14.0)
NEUT%: 68.2 % (ref 39.0–75.0)
lymph#: 1.5 10*3/uL (ref 0.9–3.3)

## 2013-05-20 LAB — URIC ACID (CC13): Uric Acid, Serum: 5.3 mg/dl (ref 2.6–7.4)

## 2013-05-20 LAB — MAGNESIUM (CC13): Magnesium: 1.9 mg/dl (ref 1.5–2.5)

## 2013-05-20 NOTE — Progress Notes (Signed)
Patient ID: Shawn Goodman, male   DOB: Feb 23, 1928, 79 y.o.   MRN: [id number]        HISTORY & PHYSICAL  DATE: 04/18/2013   FACILITY: [facility]  LEVEL OF CARE: SNF (31)  ALLERGIES:  Allergies  Allergen Reactions  . Other Other (See Comments)    Pineapple, caused problems with stomach  . Tape Rash    IV Opsite    CHIEF COMPLAINT:  Manage acute myeloid leukemia, anemia of neoplasm, and hyponatremia.    HISTORY OF PRESENT ILLNESS:  The patient is an 79 year-old, Caucasian male.    ACUTE MYELOID LEUKEMIA:  Patient was admitted to [facility] for cycle two of consolidation chemotherapy.  He tolerated the chemotherapy and is readmitted back to the facility for rehabilitation.  He denies any acute symptoms such as diarrhea, nausea or vomiting.    ANEMIA: The anemia is secondary to neoplasm as well as chemotherapy induction.  He received 2 U of packed red blood cells for a hemoglobin of 8.  The anemia has been stable. The patient denies fatigue, melena or hematochezia. No complications from the medications currently being used.  Last hemoglobin was 11.5.    HYPONATREMIA:  This has been a chronic problem.  He was given IV fluids and sodium was 136 at discharge.   Admission sodium was 128.    PAST MEDICAL HISTORY :  Past Medical History  Diagnosis Date  . AML (acute myeloblastic leukemia) 12/2012    t(1:14)  . Melanoma 11/2012    superficial  . Diverticulitis   . Basal cell carcinoma 2005  . AML (acute myeloblastic leukemia) 04/01/2013    PAST SURGICAL HISTORY: Past Surgical History  Procedure Laterality Date  . Cataract extraction w/ intraocular lens  implant, bilateral    . Inguinal hernia repair  1953  . 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 Tonsillectomy and adenoidectomy  1939  . Melanoma removal      SOCIAL HISTORY:  reports that he has quit smoking. His smoking use included Pipe and Cigars. He has never used smokeless tobacco. He reports that  he does not drink alcohol or use illicit drugs.  FAMILY HISTORY:  Family History  Problem Relation Age of Onset  . Heart disease Father   . Multiple sclerosis Brother     CURRENT MEDICATIONS: Reviewed per [facility]  REVIEW OF SYSTEMS:  See HPI otherwise 14 point ROS is negative.  PHYSICAL EXAMINATION  VS:  T 99.1       P 71      RR 14      BP 139/70      POX 97% room air        WT (Lb)  GENERAL: no acute distress, normal body habitus SKIN: warm & dry, no suspicious lesions or rashes, no excessive dryness EYES: conjunctivae normal, sclerae normal, normal eye lids MOUTH/THROAT: lips without lesions,no lesions in the mouth,tongue is without lesions,uvula elevates in midline NECK: supple, trachea midline, no neck masses, no thyroid tenderness, no thyromegaly LYMPHATICS: no LAN in the neck, no supraclavicular LAN RESPIRATORY: breathing is even & unlabored, BS CTAB CARDIAC: RRR, no murmur,no extra heart sounds, no edema GI:  ABDOMEN: abdomen soft, normal BS, no masses, no tenderness  LIVER/SPLEEN: no hepatomegaly, no splenomegaly MUSCULOSKELETAL: HEAD: normal to inspection & palpation BACK: no kyphosis, scoliosis or spinal processes tenderness EXTREMITIES: LEFT UPPER EXTREMITY: full range of motion, normal strength & tone RIGHT UPPER EXTREMITY:  full  range of motion, normal strength & tone LEFT LOWER EXTREMITY:  full range of motion, normal strength & tone RIGHT LOWER EXTREMITY:  full range of motion, normal strength & tone PSYCHIATRIC: the patient is alert & oriented to person, affect & behavior appropriate  LABS/RADIOLOGY: WBC 13.7, hemoglobin 11.3, MCV 93.1, platelets 333.    Sodium 128, glucose 143, otherwise BMP normal.   Uric acid 5.1.   Magnesium 1.9, phosphorus 4.3, LDH 189.    ASSESSMENT/PLAN:  Acute myeloid leukemia.  In remission.  Managed by Oncology.  Cycle two of chemotherapy completed.    Anemia of neoplasm.  Status post transfusion.    Hyponatremia.  Resolved  with IV fluids.    Hypomagnesemia.  Repleted.    I have reviewed patient's medical records received at admission/from hospitalization.  CPT CODE: 45409

## 2013-05-20 NOTE — Progress Notes (Signed)
ID: Macy Mis OB: [birth year]  MR#: [id number]  WUJ#:[id number]  PCP: Minda Meo, MD  Referral MD:  Miachel Roux, M.D.  DIAGNOSIS:  AML with t(1;14) on 01/11/2013    PAST THERAPY:  chemoinduction with CTSU ECOG 2906 protocol with 7+3 at [facility] with negative bone marrow biopsy on day 13. Neulasta on 01/25/2013. Recovery bone marrow biopsy on 02/19/2013 c/w remission with normal cytogenetics.  CURRENT THERAPY:  Consolidation with 2 cycles of HDAC at [facility] followed by randomization to observation vs. Decitabine    INTERVAL HISTORY:  Mr. Othar Curto returned to clinic today with his daughter.  He received 2 cycle of HDAC.  He has been feeling fair. Occasionally he has double vision. He has clear  nasal discharge.He is itching occasionally.  The patient denied dysuria, nocturia, polyuria, hematuria, myalgia, numbness, tingling, psychiatric problems.  dizzy, orthostatic hypotension, no appetite, weight loss.He denied fever, mucositis, SOB, chest pain, cough, abd pain, bleeding, skin rash.    REVIEW OF SYSTEMS: The patient denied fever, chills, night sweats, change in appetite or weight. He denied headaches. Occasionally double vision. No blurry vision, nasal congestion. He has clear  nasal discharge. He wears hearing aid. No odynophagia or dysphagia. No chest pain, palpitations, dyspnea, cough, abdominal pain, nausea, vomiting, diarrhea, constipation, hematochezia. The patient denied dysuria, nocturia, polyuria, hematuria, myalgia, numbness, tingling, psychiatric problems. He is itching occasionally.  PAST MEDICAL HISTORY: Past Medical History  Diagnosis Date  . AML (acute myeloblastic leukemia) 12/2012    t(1:14)  . Melanoma 11/2012    superficial  . Diverticulitis   . Basal cell carcinoma 2005  . AML (acute myeloblastic leukemia) 04/01/2013    PAST SURGICAL HISTORY: Past Surgical History  Procedure Laterality Date  . Cataract extraction w/ intraocular lens  implant,  bilateral    . Inguinal hernia repair  1953  . 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 Tonsillectomy and adenoidectomy  1939  . Melanoma removal      FAMILY HISTORY Family History  Problem Relation Age of Onset  . Heart disease Father   . Multiple sclerosis Brother     HEALTH MAINTENANCE: History  Substance Use Topics  . Smoking status: Former Smoker    Types: Pipe, Software engineer  . Smokeless tobacco: Never Used  . Alcohol Use: No     Comment: rarely wine     Allergies  Allergen Reactions  . Other Other (See Comments)    Pineapple, caused problems with stomach  . Tape Rash    IV Opsite    Current Outpatient Prescriptions  Medication Sig Dispense Refill  . Multiple Vitamin (MULTI-VITAMINS) TABS Take 1 tablet by mouth daily. Take 1 tablet by mouth daily.       No current facility-administered medications for this visit.    OBJECTIVE: Filed Vitals:   05/20/13 1028  BP: 87/59  Pulse: 101  Temp: 96.8 F (36 C)  Resp: 18     There is no weight on file to calculate BMI.    ECOG FS:1- 2   General:  Thin appearing man, in no acute distress.  Eyes:  no scleral icterus.  ENT:  There were no oropharyngeal lesions.  Neck was without thyromegaly.  Lymphatics:  Negative cervical, supraclavicular or axillary adenopathy.  Respiratory: lungs were clear bilaterally without wheezing or crackles.  Cardiovascular:  Regular rate and rhythm, S1/S2, without murmur, rub or gallop.  There was no pedal edema.  GI:  abdomen was soft, flat, nontender, nondistended, without  organomegaly.  Muscoloskeletal:  no spinal tenderness of palpation of vertebral spine.  Skin exam was without echymosis, petichae.   Attention was good.   Language was appropriate.  Mood was normal without depression.  Speech was not pressured.  Thought content was not tangential.    LAB RESULTS:  CMP     Component Value Date/Time   NA 133* 05/20/2013 0942   NA 135 01/07/2013 0355   K 4.1 05/20/2013 0942   K 4.2 01/07/2013  0355   CL 94* 04/08/2013 1119   CL 102 01/07/2013 0355   CO2 24 05/20/2013 0942   CO2 23 01/07/2013 0355   GLUCOSE 196* 05/20/2013 0942   GLUCOSE 200* 04/08/2013 1119   GLUCOSE 111* 01/07/2013 0355   BUN 6.2* 05/20/2013 0942   BUN 17 01/07/2013 0355   CREATININE 0.8 05/20/2013 0942   CREATININE 1.02 01/07/2013 0355   CALCIUM 9.3 05/20/2013 0942   CALCIUM 8.4 01/07/2013 0355   PROT 6.0* 05/20/2013 0942   PROT 5.6* 01/07/2013 0355   ALBUMIN 3.2* 05/20/2013 0942   ALBUMIN 2.6* 01/07/2013 0355   AST 28 05/20/2013 0942   AST 15 01/07/2013 0355   ALT 25 05/20/2013 0942   ALT 15 01/07/2013 0355   ALKPHOS 73 05/20/2013 0942   ALKPHOS 42 01/07/2013 0355   BILITOT 0.55 05/20/2013 0942   BILITOT 0.3 01/07/2013 0355   GFRNONAA 66* 01/07/2013 0355   GFRAA 77* 01/07/2013 0355    Lab Results  Component Value Date   WBC 7.9 05/20/2013   NEUTROABS 5.4 05/20/2013   HGB 12.3* 05/20/2013   HCT 36.0* 05/20/2013   MCV 98.5* 05/20/2013   PLT 154 05/20/2013      Chemistry      Component Value Date/Time   NA 133* 05/20/2013 0942   NA 135 01/07/2013 0355   K 4.1 05/20/2013 0942   K 4.2 01/07/2013 0355   CL 94* 04/08/2013 1119   CL 102 01/07/2013 0355   CO2 24 05/20/2013 0942   CO2 23 01/07/2013 0355   BUN 6.2* 05/20/2013 0942   BUN 17 01/07/2013 0355   CREATININE 0.8 05/20/2013 0942   CREATININE 1.02 01/07/2013 0355      Component Value Date/Time   CALCIUM 9.3 05/20/2013 0942   CALCIUM 8.4 01/07/2013 0355   ALKPHOS 73 05/20/2013 0942   ALKPHOS 42 01/07/2013 0355   AST 28 05/20/2013 0942   AST 15 01/07/2013 0355   ALT 25 05/20/2013 0942   ALT 15 01/07/2013 0355   BILITOT 0.55 05/20/2013 0942   BILITOT 0.3 01/07/2013 0355       1.  Diagnosis:  Acute myeloid leukemia (AML). 2.  Status:  In remission.  S/p two dose of high dose cytarabine consolidation. 3.  Follow up:     * Lab and possible transfusion according protocol.  * Goal of transfusion for Hgb <9 or Platelet <20 with irradiated blood products.    * Return visit with  [facility] around 05/10/13 for restaging bone marrow biopsy.  Return visit with the [facility] lin 4 months.     Myra Rude, MD   05/20/2013 6:24 PM

## 2013-05-22 ENCOUNTER — Encounter: Payer: Self-pay | Admitting: *Deleted

## 2013-05-22 NOTE — Progress Notes (Signed)
Labs from 7/28 faxed to [facility] at fax [phone].

## 2013-05-23 ENCOUNTER — Other Ambulatory Visit: Payer: Medicare Other | Admitting: Lab

## 2013-05-23 ENCOUNTER — Telehealth: Payer: Self-pay | Admitting: Hematology and Oncology

## 2013-05-23 DIAGNOSIS — E871 Hypo-osmolality and hyponatremia: Secondary | ICD-10-CM | POA: Insufficient documentation

## 2013-05-23 NOTE — Telephone Encounter (Signed)
called pt and he did not want to r/s at this time will call back to r/s

## 2013-05-24 ENCOUNTER — Encounter: Payer: Self-pay | Admitting: Adult Health

## 2013-05-29 ENCOUNTER — Other Ambulatory Visit: Payer: Self-pay

## 2013-05-31 DIAGNOSIS — T451X5A Adverse effect of antineoplastic and immunosuppressive drugs, initial encounter: Secondary | ICD-10-CM

## 2013-05-31 DIAGNOSIS — D6481 Anemia due to antineoplastic chemotherapy: Secondary | ICD-10-CM

## 2013-05-31 DIAGNOSIS — C9201 Acute myeloblastic leukemia, in remission: Secondary | ICD-10-CM

## 2013-05-31 DIAGNOSIS — R634 Abnormal weight loss: Secondary | ICD-10-CM

## 2013-05-31 DIAGNOSIS — C439 Malignant melanoma of skin, unspecified: Secondary | ICD-10-CM

## 2013-06-21 ENCOUNTER — Encounter: Payer: Self-pay | Admitting: *Deleted

## 2013-06-21 DIAGNOSIS — C9200 Acute myeloblastic leukemia, not having achieved remission: Secondary | ICD-10-CM

## 2013-06-25 ENCOUNTER — Telehealth: Payer: Self-pay | Admitting: Hematology and Oncology

## 2013-06-25 NOTE — Telephone Encounter (Signed)
, °

## 2013-08-29 ENCOUNTER — Other Ambulatory Visit: Payer: Self-pay

## 2013-09-09 ENCOUNTER — Telehealth: Payer: Self-pay | Admitting: Hematology and Oncology

## 2013-09-09 NOTE — Telephone Encounter (Signed)
Pt cancelled appt , pt went to [facility]

## 2013-09-16 ENCOUNTER — Ambulatory Visit: Payer: Medicare Other | Admitting: Hematology and Oncology

## 2013-09-16 ENCOUNTER — Other Ambulatory Visit: Payer: Medicare Other | Admitting: Lab

## 2013-10-01 NOTE — Progress Notes (Signed)
This encounter was created in error - please disregard.

## 2013-12-20 NOTE — Progress Notes (Signed)
This encounter was created in error - please disregard.

## 2014-07-16 ENCOUNTER — Telehealth: Payer: Self-pay | Admitting: *Deleted

## 2014-07-16 NOTE — Telephone Encounter (Signed)
Received copy of Dr. Abel Presto office visit with patient on 07/08/14. Copy given to Dr. Marko Plume for review and original sent to HIM to be scanned.

## 2014-08-08 ENCOUNTER — Other Ambulatory Visit: Payer: Self-pay

## 2015-03-18 IMAGING — CR DG SINUSES COMPLETE 3+V
4 series · 4 of 4 positions shown · non-contrast
Comparison: None.

CLINICAL DATA: Sinus drainage.  Pain forehead and left-side of head

PARANASAL SINUSES - COMPLETE 3 + VIEW

[[person_name]]
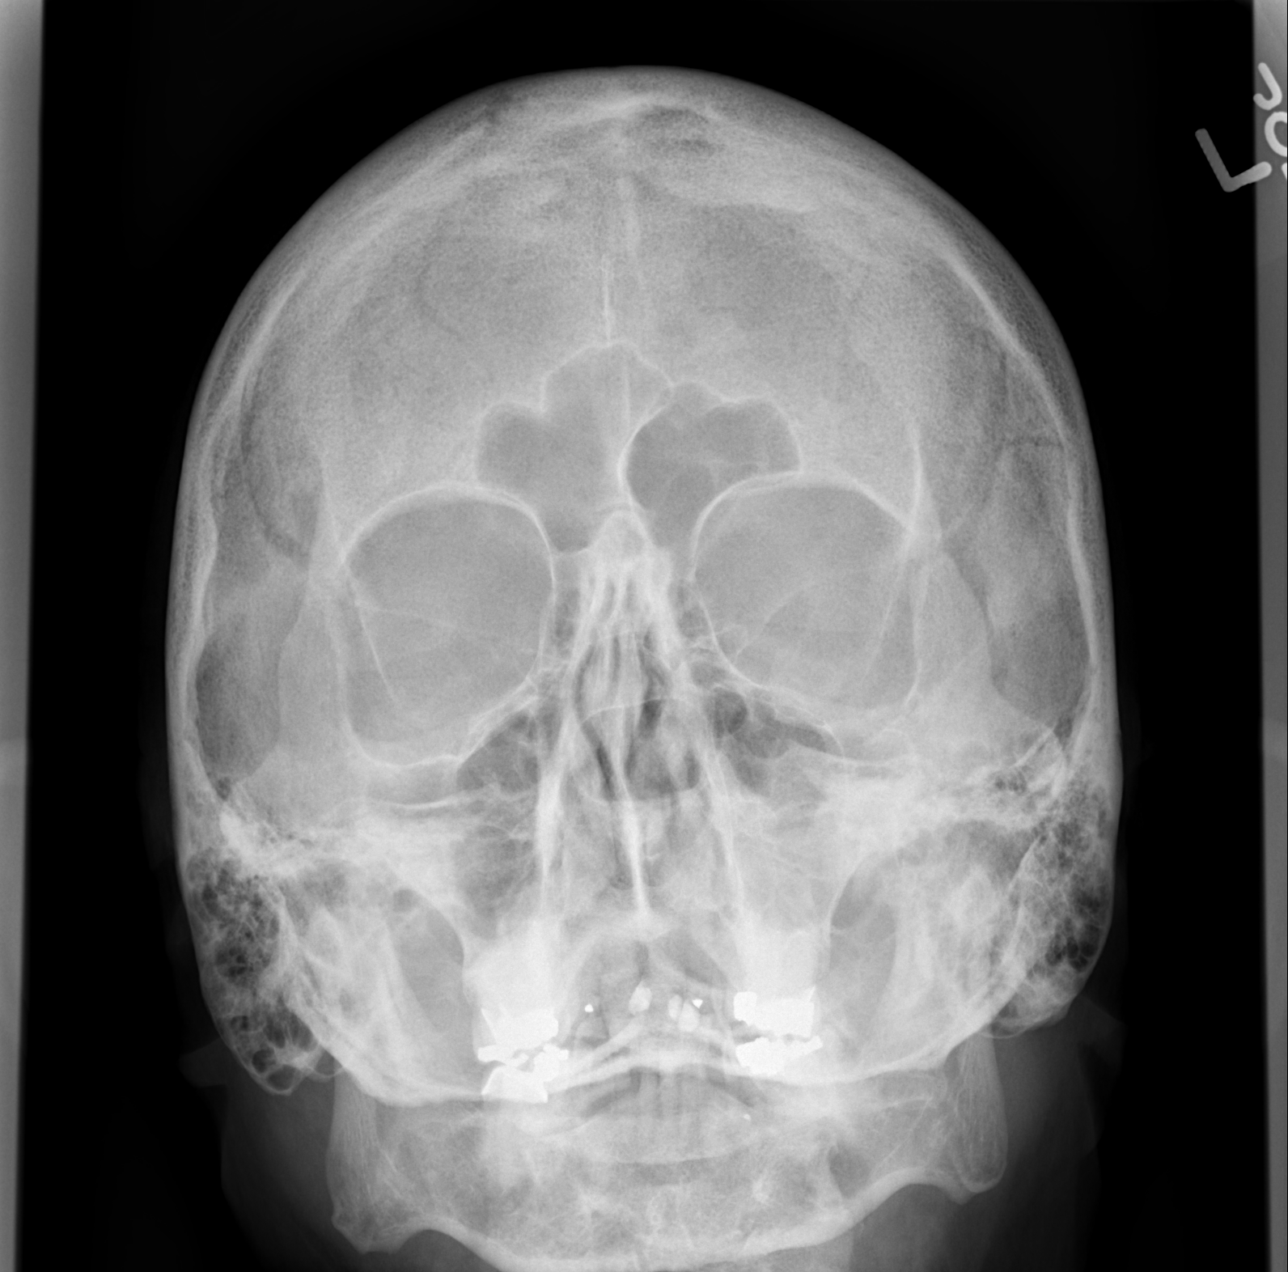

[w waters]
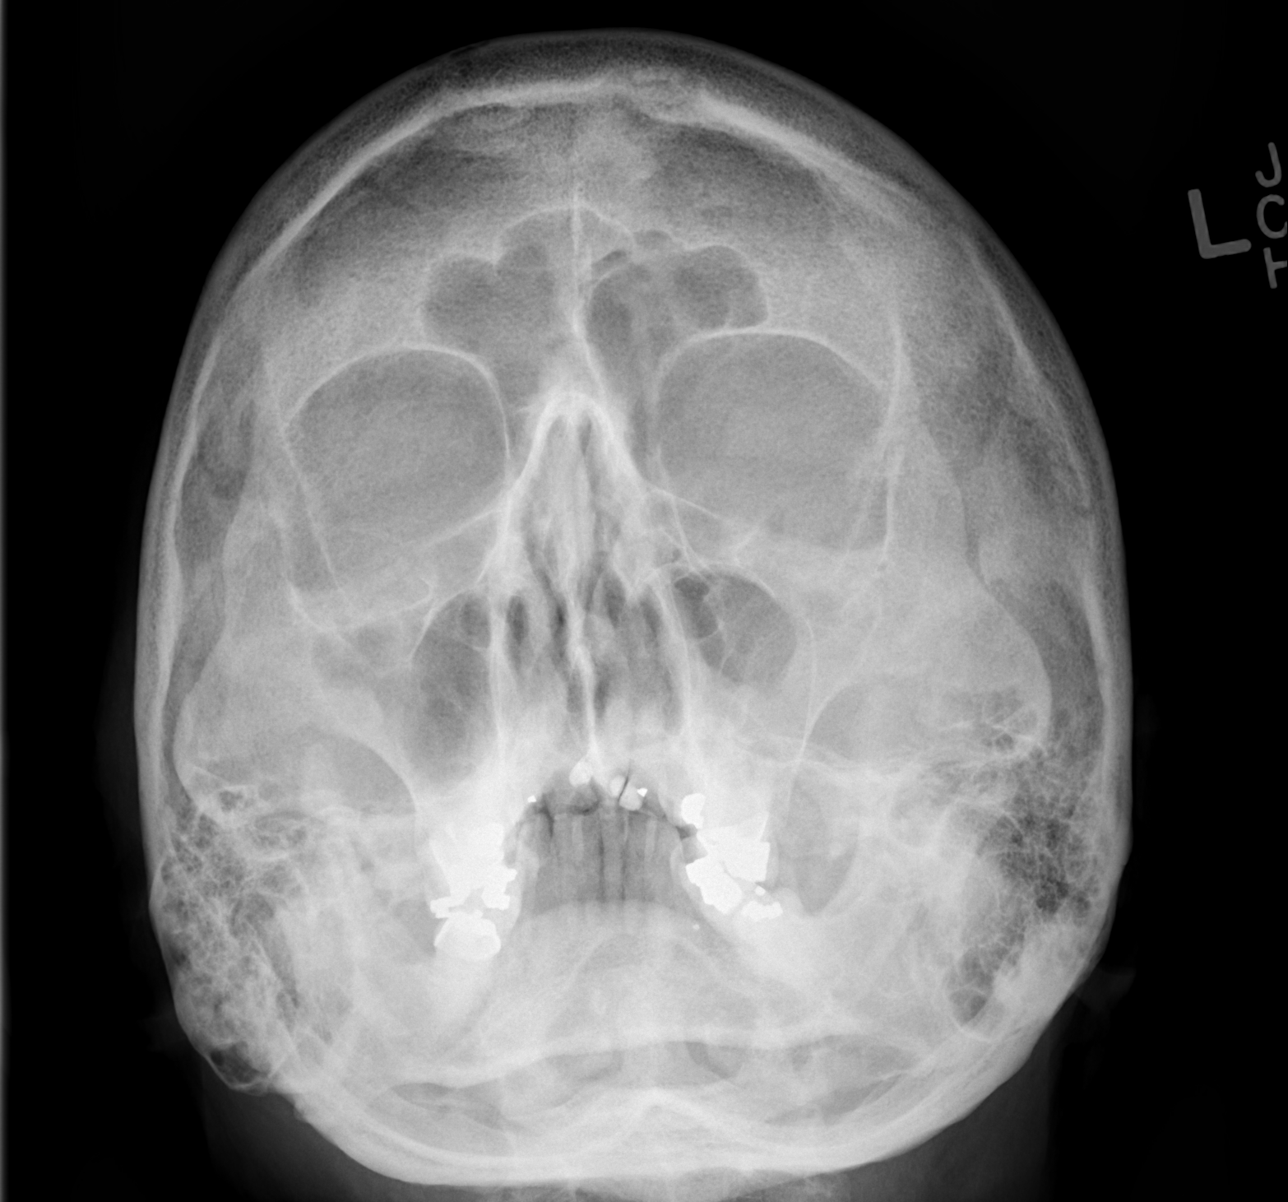

[w skull lat]
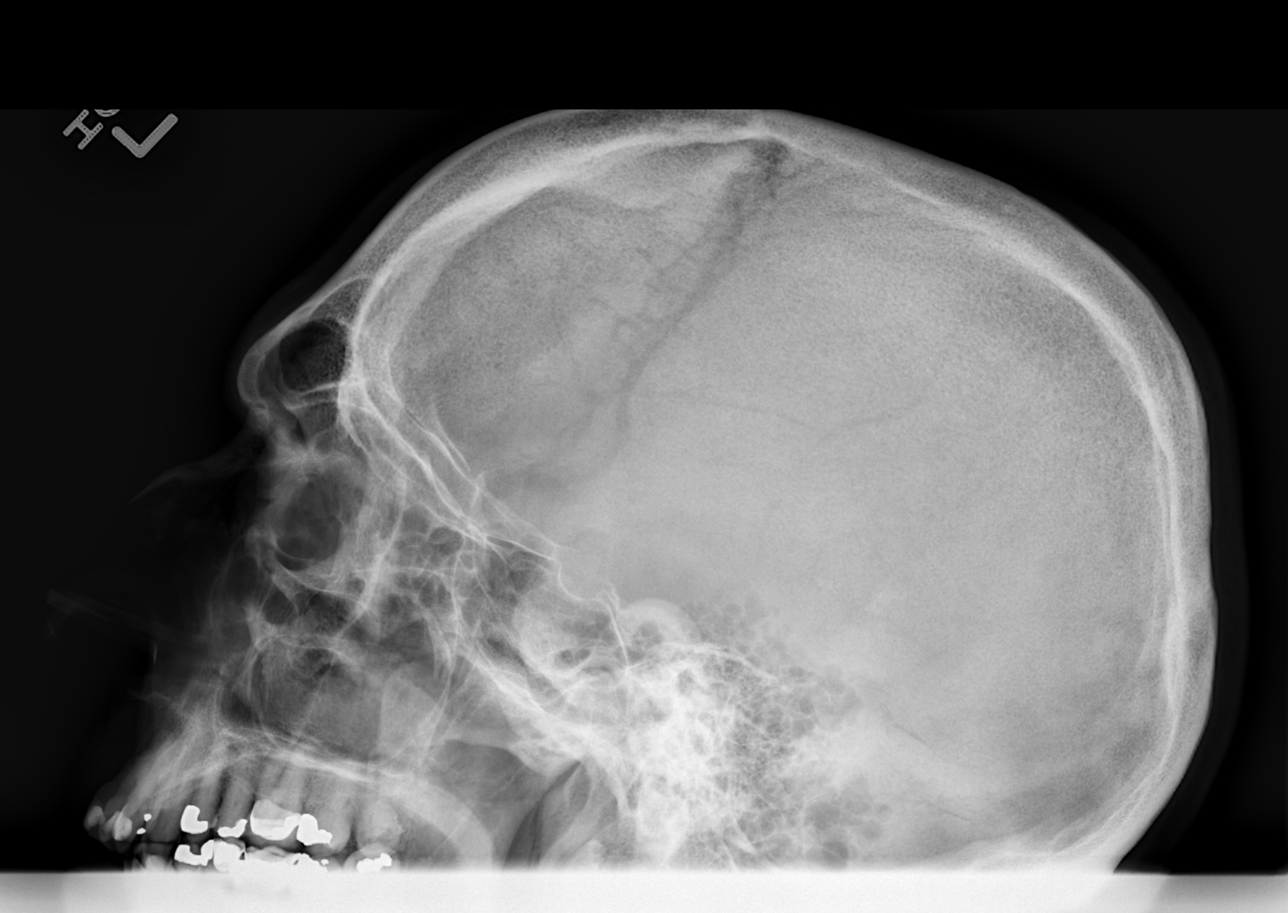

[w smv]
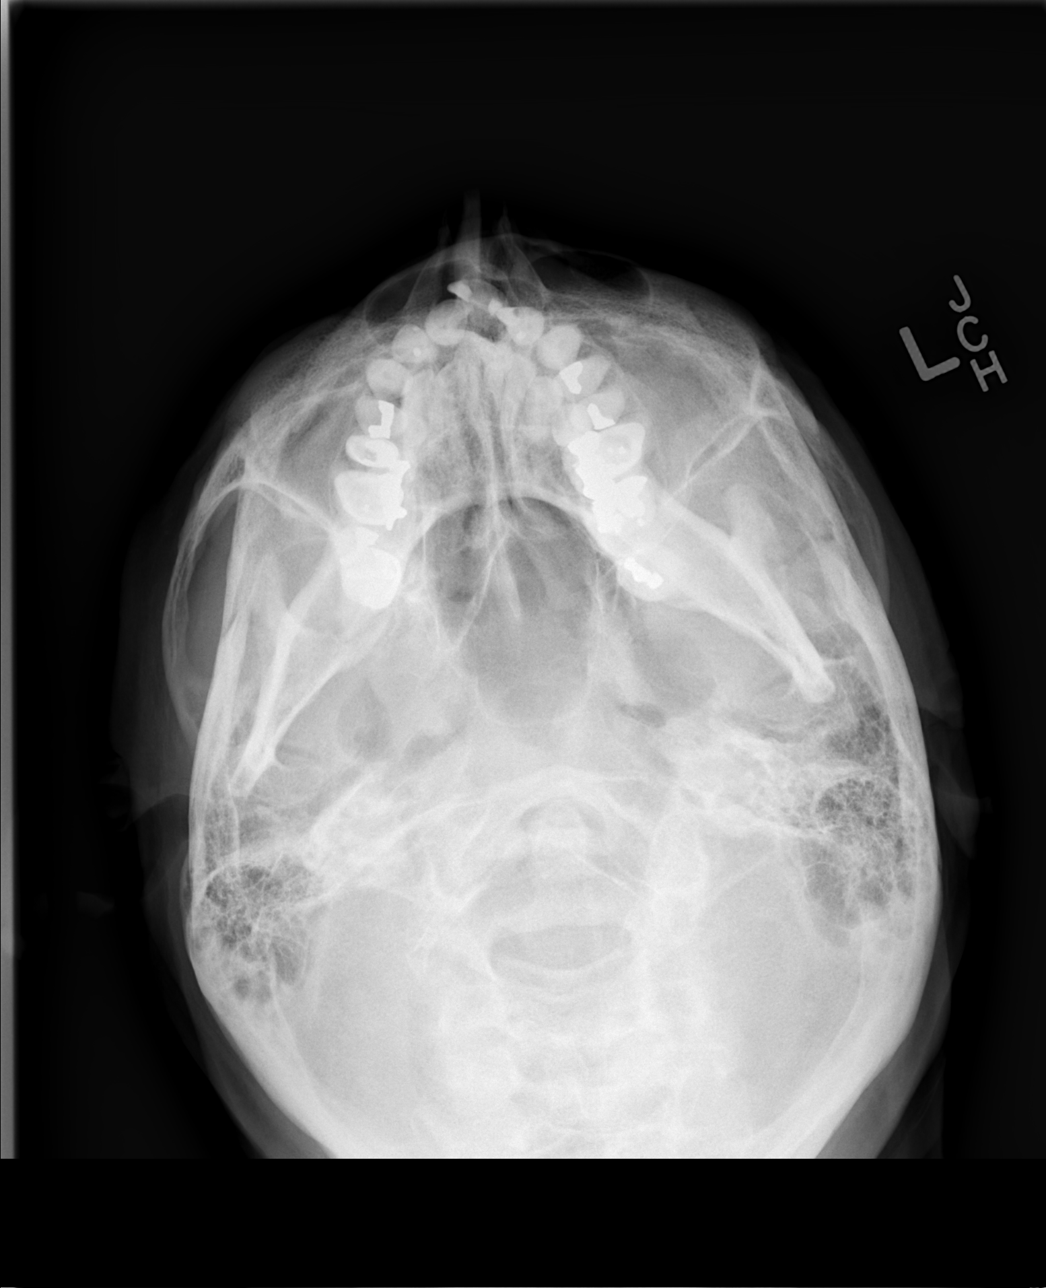

[4 of 4 positions shown; findings below may reference images not displayed]

FINDINGS: Mucosal thickening/partial opacification maxillary
sinuses more notable on the left.

Remainder of the paranasal sinuses appear clear by plain film
examination.
IMPRESSION: Mucosal thickening/partial opacification maxillary sinuses more
notable on the left.

## 2015-03-18 IMAGING — CT CT BIOPSY
1 series · 2 of 4 positions shown, 5 images · non-contrast
Comparison: none

CT GUIDED RIGHT ILIAC BONE MARROW ASPIRATION AND BONE MARROW CORE
BIOPSIES

Date:
CLINICAL HISTORY: 84 year old male with neutropenia and anemia.
Bone marrow biopsy is requested.

[Series 9: (hospital) 6.0 b30f · axial · 0.76mm/px · z∈[+1049,+1049]mm · 2 of 4 slices shown, 5 images]
[im 2/4  soft-tissue]
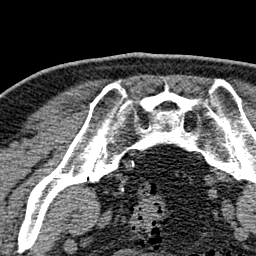
[im 2/4  lung]
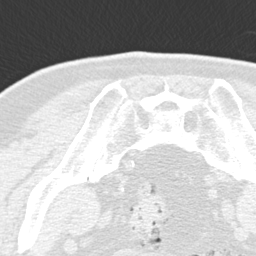
[im 2/4  bone]
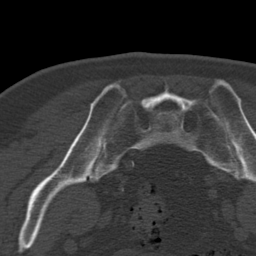
[im 3/4  soft-tissue]
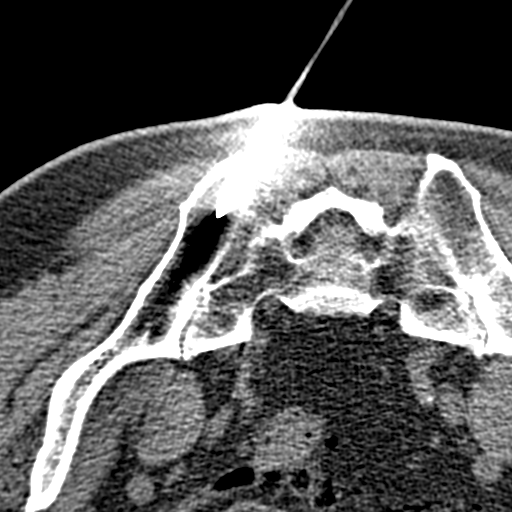
[im 3/4  lung]
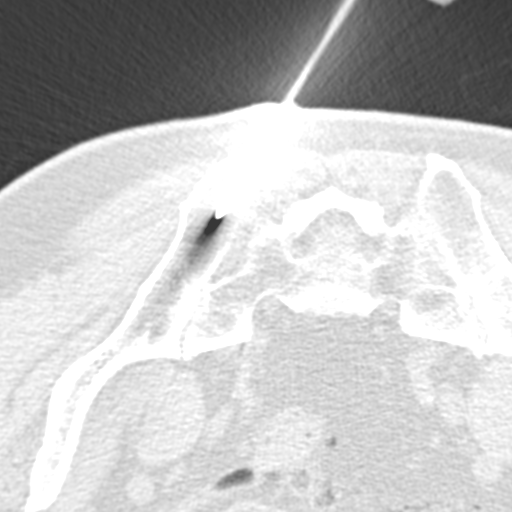

[2 of 4 positions shown; findings below may reference images not displayed]

Procedures Performed:

1. CT guided bone marrow aspiration and core biopsy

Sedation: Moderate (conscious) sedation was used. Two mg Versed,
100 mcg Fentanyl were administered intravenously.  The patient's
vital signs were monitored continuously by radiology nursing
throughout the procedure.

Sedation Time: 10 minutes

PROCEDURE/FINDINGS:

Informed consent was obtained from the patient following
explanation of the procedure, risks, benefits and alternatives.
The patient understands, agrees and consents for the procedure.
All questions were addressed.  A time out was performed.

The patient was positioned prone and noncontrast localization CT
was performed of the pelvis to demonstrate the iliac marrow spaces.

Maximal barrier sterile technique utilized including caps, mask,
sterile gowns, sterile gloves, large sterile drape, hand hygiene,
and betadine prep.

Under sterile conditions and local anesthesia, an 11 gauge coaxial
bone biopsy needle was advanced into the right iliac marrow space.
Needle position was confirmed with CT imaging. Initially, bone
marrow aspiration was performed. Next, the 11 gauge outer cannula
was utilized to obtain a right iliac bone marrow core biopsy.
Needle was removed. Hemostasis was obtained with compression. The
patient tolerated the procedure well. Samples were prepared with
the cytotechnologist. No immediate complications.
IMPRESSION: CT guided right iliac bone marrow aspiration and core biopsy.

[REDACTED]

## 2015-04-20 ENCOUNTER — Other Ambulatory Visit: Payer: Self-pay

## 2015-05-07 ENCOUNTER — Encounter: Payer: Self-pay | Admitting: Internal Medicine

## 2015-06-26 LAB — HEPATIC FUNCTION PANEL
ALK PHOS: 39 U/L (ref 25–125)
ALT: 13 U/L (ref 10–40)
AST: 15 U/L (ref 14–40)
Bilirubin, Total: 0.5 mg/dL

## 2015-07-01 LAB — BASIC METABOLIC PANEL: Sodium: 136 mmol/L — AB (ref 137–147)

## 2015-07-01 LAB — CBC AND DIFFERENTIAL
HCT: 25 % — AB (ref 41–53)
Hemoglobin: 8.4 g/dL — AB (ref 13.5–17.5)
PLATELETS: 7 10*3/uL — AB (ref 150–399)

## 2015-07-02 LAB — BASIC METABOLIC PANEL: POTASSIUM: 3.9 mmol/L (ref 3.4–5.3)

## 2015-07-03 LAB — BASIC METABOLIC PANEL
BUN: 23 mg/dL — AB (ref 4–21)
CREATININE: 1 mg/dL (ref <=1.3)
GLUCOSE: 95 mg/dL

## 2015-07-03 LAB — CBC AND DIFFERENTIAL: WBC: 2.9 10^3/mL

## 2015-07-07 ENCOUNTER — Encounter: Payer: Self-pay | Admitting: Adult Health

## 2015-07-07 ENCOUNTER — Non-Acute Institutional Stay (SKILLED_NURSING_FACILITY): Payer: Medicare Other | Admitting: Adult Health

## 2015-07-07 DIAGNOSIS — I82409 Acute embolism and thrombosis of unspecified deep veins of unspecified lower extremity: Secondary | ICD-10-CM | POA: Insufficient documentation

## 2015-07-07 DIAGNOSIS — R636 Underweight: Secondary | ICD-10-CM | POA: Diagnosis not present

## 2015-07-07 DIAGNOSIS — R5381 Other malaise: Secondary | ICD-10-CM

## 2015-07-07 DIAGNOSIS — I82401 Acute embolism and thrombosis of unspecified deep veins of right lower extremity: Secondary | ICD-10-CM

## 2015-07-07 DIAGNOSIS — C9200 Acute myeloblastic leukemia, not having achieved remission: Secondary | ICD-10-CM

## 2015-07-07 DIAGNOSIS — B370 Candidal stomatitis: Secondary | ICD-10-CM

## 2015-07-07 NOTE — Progress Notes (Signed)
This encounter was created in error - please disregard.

## 2015-07-07 NOTE — Progress Notes (Signed)
Patient ID: Shawn Goodman, male   DOB: [birth year], 81 y.o.   MRN: [id number]    DATE:  07/07/2015 MRN:  [id number]  BIRTHDAY: 1928-05-10  Facility:  Nursing Home Location:  [facility] Room Number: 1107-P  LEVEL OF CARE:  SNF (31)  Contact Information    Name Relation Home Work Alligator R Spouse [place] Daughter [phone]        Chief Complaint  Patient presents with  . Hospitalization Follow-up    Physical deconditioning, AML, right IJ DVT, oral Candidiasis, underweight and hypomagnesemia    HISTORY OF PRESENT ILLNESS:  This is an 81 year old male who has been admitted to [facility] on 07/06/15 from [facility]. He has PMH of AML S/P 9c vidaza treatment (last in March 2016, but not currently on treatment due to intolerance), with chronic neutropenia, thrombocytopenia,anemia requiring twice weekly transfusions and BPH. He was admitted to hospital with AML with neutropenic fever, 101.19F. He was asymptomatic. Chest x-ray was negative as well as blood cultures. He was treated for oral Candidiasis with nystatin swish and swallow. He was started on Vanc/Zosyn, continued on acyclovir and shifted to by mouth Avelox. He developed right upper extremity DVT (Right IJ) for which anticoagulation was contraindicated and setting of thrombocytopenia. He re-fevered when he developed RUE DVT so he was placed on vancomycin/Zosyn 2 additional days then descalated to PO Avelox again since fever was thought to be due to DVT. He was restarted on Levaquin for prophylaxis and mucositis mouthwash upon discharge.  He has been admitted for a short-term rehabilitation.  PAST MEDICAL HISTORY:  Past Medical History  Diagnosis Date  . AML (acute myeloblastic leukemia) 12/2012    t(1:14)  . Melanoma 11/2012    superficial  . Diverticulitis   . Basal cell carcinoma 2005  . AML (acute myeloblastic leukemia) 04/01/2013      CURRENT MEDICATIONS: Reviewed  Patient's Medications  New Prescriptions   No medications on file  Previous Medications   ACYCLOVIR (ZOVIRAX) 400 MG TABLET    Take 1 tablet by mouth twice a day with neutropenic   ALBUTEROL (ACCUNEB) 1.25 MG/3ML NEBULIZER SOLUTION    Take by nebulization. Take 6 ml by(2.5 mg total ) by nebulization every 4 hours as needed for Wheezing   FLUCONAZOLE (DIFLUCAN) 200 MG TABLET    Take 1 tablet by mouth every day while neutropenic   LEVOFLOXACIN (LEVAQUIN) 500 MG TABLET    Take 1 tablet by mouth every day while neutropenic   MAGIC MOUTHWASH SOLN    Take 30 mLs by mouth every 4 (four) hours as needed for mouth pain.   MAGNESIUM OXIDE (MAG-OX) 400 MG TABLET    Take 400 mg by mouth 2 (two) times daily.   MIRTAZAPINE (REMERON) 30 MG TABLET    Take 30 mg by mouth at bedtime.   MULTIPLE VITAMIN (MULTI-VITAMINS) TABS    Take 1 tablet by mouth daily. Take 1 tablet by mouth daily.   NYSTATIN (MYCOSTATIN) 500000 UNITS TABS TABLET    Take 500,000 Units by mouth 4 (four) times daily.   ZINC SULFATE 220 MG CAPSULE    Take 220 mg by mouth daily.  Modified Medications   No medications on file  Discontinued Medications   DIPHENHYDRAMINE (BENADRYL) 50 MG/ML INJECTION    Inject 12.5 mg into the vein as needed for itching.   DIPHENHYDRAMINE HCL PO    Take by  mouth. ( Mouthwash ) take 30 ml by mouth every 4 hours as needed  (swish and spit )   HEPARIN, PORCINE, IN NACL IJ    Inject as directed. 50 units intracatheter as needed   MOXIFLOXACIN (AVELOX) 400 MG TABLET    Take 400 mg by mouth daily at 8 pm.   ONDANSETRON HCL (ZOFRAN IJ)    Inject as directed. 4 mg Intravenous every 6 hours prn     Allergies  Allergen Reactions  . Other Other (See Comments)    Pineapple, caused problems with stomach  . Chlorhexidine Rash    Patient broke out in rash over port site after having the new Tegaderm CHG dressing with the chg gel patch. Bio patches do not seem do not seem to cause  the same reaction.  . Flagyl [Metronidazole] Rash    ( Allergy/intolerance  . Tape Rash    IV Opsite     REVIEW OF SYSTEMS:  GENERAL: no chills  EYES: Denies change in vision, dry eyes, eye pain, itching or discharge EARS: Denies change in hearing, ringing in ears, or earache NOSE: Denies nasal congestion or epistaxis MOUTH and THROAT: Denies oral discomfort, gingival pain or bleeding, pain from teeth or hoarseness   RESPIRATORY: no cough, SOB, DOE, wheezing, hemoptysis CARDIAC: no chest pain, edema or palpitations GI: no abdominal pain, diarrhea, constipation, heart burn, nausea or vomiting GU: Denies dysuria, frequency, hematuria, incontinence, or discharge PSYCHIATRIC: Denies feeling of depression or anxiety. No report of hallucinations, insomnia, paranoia, or agitation   PHYSICAL EXAMINATION  GENERAL APPEARANCE:  In no acute distress. SKIN:  RUE has edema 2+ and erythema, noted black suture sticking out of R inner upper arm HEAD: Normal in size and contour. No evidence of trauma EYES: Lids open and close normally. No blepharitis, entropion or ectropion. PERRL. Conjunctivae are clear and sclerae are white. Lenses are without opacity EARS: Pinnae are normal. Patient hears normal voice tunes of the examiner MOUTH and THROAT: Lips are without lesions. Oral mucosa is moist and without lesions. Tongue has whitish coating NECK: supple, trachea midline, no neck masses, no thyroid tenderness, no thyromegaly LYMPHATICS: no LAN in the neck, no supraclavicular LAN RESPIRATORY: breathing is even & unlabored, BS CTAB CARDIAC: RRR, no murmur,no extra heart sounds, trace edema BLE GI: abdomen soft, normal BS, no masses, no tenderness, no hepatomegaly, no splenomegaly EXTREMITIES:  Able to move 4 extremities PSYCHIATRIC: Alert and oriented X 3. Affect and behavior are appropriate  LABS/RADIOLOGY: Labs reviewed: 07/03/15  WBC 3.1 hemoglobin 10.9 hematocrit 32.6 platelet 45 Basic Metabolic  Panel:  Recent Labs  07/01/15 07/02/15 07/03/15  NA 136*  --   --   K  --  3.9  --   BUN  --   --  23*  CREATININE  --   --  1.0   Liver Function Tests:  Recent Labs  06/26/15  AST 15  ALT 13  ALKPHOS 39   CBC:  Recent Labs  07/01/15 07/03/15  WBC  --  2.9  HGB 8.4*  --   HCT 25*  --   PLT 7*  --     ASSESSMENT/PLAN:  Physical deconditioning - for rehabilitation  AML with chronic neutropenia, thrombocytopenia and anemia - platelet 43, hemoglobin 10.1; continue Zovirax 400 mg 1 tab by mouth twice a day, Diflucan 200 mg 1 tab by mouth daily and Levaquin 500 mg 1 tab by mouth daily; follow-up with Hematology/Oncology, Dr. Florene Glen (daughter has arranged for appointment on  9/16)  Right IJ DVT - S/P RUE angioplasty since anticoagulation is contraindicated  Oral Candidiasis - had nystatin she was and swallow in the hospital; continue mucositis mouthwash 30 mL every 4 hours when necessary  Underweight - BMI 17; RD consult; continue Remeron 30 mg 1 tab by mouth daily at bedtime and zinc sulfate 220 mg 1 tab by mouth daily  Hypomagnesemia - continue magnesium oxide 400 mg 1 tab by mouth twice a day     [facility], NP [place]

## 2015-07-13 ENCOUNTER — Non-Acute Institutional Stay (SKILLED_NURSING_FACILITY): Payer: Medicare Other | Admitting: Internal Medicine

## 2015-07-13 DIAGNOSIS — R600 Localized edema: Secondary | ICD-10-CM | POA: Diagnosis not present

## 2015-07-13 DIAGNOSIS — E43 Unspecified severe protein-calorie malnutrition: Secondary | ICD-10-CM | POA: Diagnosis not present

## 2015-07-13 DIAGNOSIS — F32A Depression, unspecified: Secondary | ICD-10-CM

## 2015-07-13 DIAGNOSIS — I82401 Acute embolism and thrombosis of unspecified deep veins of right lower extremity: Secondary | ICD-10-CM | POA: Diagnosis not present

## 2015-07-13 DIAGNOSIS — D709 Neutropenia, unspecified: Secondary | ICD-10-CM

## 2015-07-13 DIAGNOSIS — C9200 Acute myeloblastic leukemia, not having achieved remission: Secondary | ICD-10-CM

## 2015-07-13 DIAGNOSIS — F329 Major depressive disorder, single episode, unspecified: Secondary | ICD-10-CM | POA: Diagnosis not present

## 2015-07-13 DIAGNOSIS — B370 Candidal stomatitis: Secondary | ICD-10-CM | POA: Diagnosis not present

## 2015-07-13 DIAGNOSIS — J9601 Acute respiratory failure with hypoxia: Secondary | ICD-10-CM | POA: Diagnosis not present

## 2015-07-13 DIAGNOSIS — R509 Fever, unspecified: Secondary | ICD-10-CM

## 2015-07-13 DIAGNOSIS — R5381 Other malaise: Secondary | ICD-10-CM

## 2015-07-13 NOTE — Progress Notes (Signed)
Patient ID: Shawn Goodman, male   DOB: 12-14-1927, 81 y.o.   MRN: [id number]     [facility]   PCP: ARONSON,RICHARD A, MD  Code Status: full code  Allergies  Allergen Reactions  . Other Other (See Comments)    Pineapple, caused problems with stomach  . Chlorhexidine Rash    Patient broke out in rash over port site after having the new Tegaderm CHG dressing with the chg gel patch. Bio patches do not seem do not seem to cause the same reaction.  . Flagyl [Metronidazole] Rash    ( Allergy/intolerance  . Tape Rash    IV Opsite    Chief Complaint  Patient presents with  . New Admit To SNF     HPI:  81 y.o. patient is here for short term rehabilitation post hospital admission from 06/23/15-07/06/15 with neutropenic fever, oral candidiasis, acute hypoxic respiratory failure from fluid overload and right upper extremity DVT. He has history of AML with chronic thrombocytopenia and anemia. He is seen in his room today. Over the weekend he had 2 temperature spikes of 103 and 102 per nursing and he was given tylenol which brought his temperature down. Provider on call was not informed. This morning while therapy came in to work with him, his o2 sat was 85% on 1 l o2 at rest. His oxygen was increased to 2 litres and he was then oxygenating at 95%. He is extremely weak and deconditioned. He is afebrile this am and denies any concerns to me. His daughter and therapy team are present in the room.  Review of Systems:  Constitutional: Negative for fever, chills, diaphoresis at present.  HENT: Negative for headache, congestion, nasal discharge Eyes: Negative for eye pain, blurred vision, double vision and discharge.  Respiratory: Negative for cough, wheezing.   Cardiovascular: Negative for chest pain, palpitations. Positive for leg swelling and was lasix 40 mg daily x 5 days on 07/10/15.  Gastrointestinal: Negative for heartburn, nausea, vomiting, abdominal  pain Genitourinary: Negative for dysuria, flank pain.  Musculoskeletal: Negative for back pain and falls in facility Skin: Negative for itching, rash.  Neurological: Negative for dizziness Psychiatric/Behavioral: Negative for depression.    Past Medical History  Diagnosis Date  . AML (acute myeloblastic leukemia) 12/2012    t(1:14)  . Melanoma 11/2012    superficial  . Diverticulitis   . Basal cell carcinoma 2005  . AML (acute myeloblastic leukemia) 04/01/2013   Past Surgical History  Procedure Laterality Date  . Cataract extraction w/ intraocular lens  implant, bilateral    . Inguinal hernia repair  1953  . 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 Tonsillectomy and adenoidectomy  1939  . Melanoma removal     Social History:   reports that he has quit smoking. His smoking use included Pipe and Cigars. He has never used smokeless tobacco. He reports that he does not drink alcohol or use illicit drugs.  Family History  Problem Relation Age of Onset  . Heart disease Father   . Multiple sclerosis Brother     Medications:   Medication List       This list is accurate as of: 07/13/15  2:49 PM.  Always use your most recent med list.               acyclovir 400 MG tablet  Commonly known as:  ZOVIRAX  Take 1 tablet by mouth twice a day with neutropenic  albuterol 1.25 MG/3ML nebulizer solution  Commonly known as:  ACCUNEB  Take by nebulization. Take 6 ml by(2.5 mg total ) by nebulization every 4 hours as needed for Wheezing     fluconazole 200 MG tablet  Commonly known as:  DIFLUCAN  Take 1 tablet by mouth every day while neutropenic     furosemide 40 MG tablet  Commonly known as:  LASIX  Take 40 mg by mouth daily.     levofloxacin 500 MG tablet  Commonly known as:  LEVAQUIN  Take 1 tablet by mouth every day while neutropenic     magnesium oxide 400 MG tablet  Commonly known as:  MAG-OX  Take 400 mg by mouth 2 (two) times daily.     mirtazapine 30 MG  tablet  Commonly known as:  REMERON  Take 30 mg by mouth at bedtime.     MULTI-VITAMINS Tabs  Take 1 tablet by mouth daily. Take 1 tablet by mouth daily.     nystatin 500000 UNITS Tabs tablet  Commonly known as:  MYCOSTATIN  Take 500,000 Units by mouth 4 (four) times daily.     zinc sulfate 220 MG capsule  Take 220 mg by mouth daily.         Physical Exam: Filed Vitals:   07/13/15 1402  BP: 152/91  Pulse: 116  Temp: 97.9 F (36.6 C)  Resp: 20  SpO2: 95%    General- elderly frail male in no acute distress Head- normocephalic, atraumatic Nose- normal nasal mucosa, no maxillary or frontal sinus tenderness, no nasal discharge Throat- moist mucus membrane, oral thrush Eyes- PERRLA, EOMI, no pallor, no icterus, no discharge, normal conjunctiva, normal sclera Neck- no cervical lymphadenopathy, no jugular vein distension Cardiovascular- irregular heart rate, 1+ leg edema Respiratory- bilateral poor air entry, no wheeze, no rhonchi, no crackles, no use of accessory muscles, on o2 Abdomen- bowel sounds present, soft, non tender Musculoskeletal- able to move all 4 extremities, generalized weakness Neurological- no focal deficit, alert and oriented Skin- warm and dry, right arm bruising Psychiatry- normal mood and affect    Labs reviewed: Basic Metabolic Panel:  Recent Labs  07/01/15 07/02/15 07/03/15  NA 136*  --   --   K  --  3.9  --   BUN  --   --  23*  CREATININE  --   --  1.0   Liver Function Tests:  Recent Labs  06/26/15  AST 15  ALT 13  ALKPHOS 39   No results for input(s): LIPASE, AMYLASE in the last 8760 hours. No results for input(s): AMMONIA in the last 8760 hours. CBC:  Recent Labs  07/01/15 07/03/15  WBC  --  2.9  HGB 8.4*  --   HCT 25*  --   PLT 7*  --     Assessment/Plan  Physical deconditioning Will have him work with physical therapy and occupational therapy team to help with gait training and muscle strengthening exercises.fall  precautions. Skin care. Encourage to be out of bed.   Fever Afebrile at present. Febrile over the weekend. Currently on neutropenic precautions with recent neutropenic fever and hospital admission and his ongoing neutropenia. Continue fluconazole, acyclovir and levaquin prophylactic dosing. Have tried to contact dr powell's office and have left detailed message with the triage person for call back to discuss on further plan of care. Patient also has appointment to see dr Florene Glen 07/14/15. Continue neutropenic precautions for now. Get cxr and u/a with c/s. If has another temperature spike,  will send blood culture  Oral Candidiasis  Continue nystatin 50,000 u 5 ml moutwash qid for 10 days and monitor  Severe protein calorie malnutrition Monitor weight and po intake. Will get dietary consult to assess for nutritional supplement. Continue remeron to help stimulate appetite. Continue zinc sulfate and multivitamin. Pressure ulcer prophylaxis  Hypoxia With drop in o2 sat at rest this am. With poor air entry get cxr to assess for infiltrate/ pneumonia. Continue o2 at 2 l and prn albuterol. No signs of fluid overload on lung auscultation  Leg edema Continue lasix 40 mg daily for now, as per daughter the swelling has improved  AML  with chronic neutropenia, thrombocytopenia and anemia. continue acyclovir 400 mg bid, diflucan 200 mg daily, levaquin 500 mg daily and to follow with dr Florene Glen  Right IJ DVT  S/P RUE angioplasty, not on anticoagulation with thrombocytopenia  Depression Stable mood, continue remeron 30 mg daily  Hypomagnesemia continue magnesium oxide 400 mg bid   Goals of care: short term rehabilitation   Labs/tests ordered: cxr, cbc, cmp  Family/ staff Communication: reviewed care plan with patient and nursing supervisor    Blanchie Serve, MD  [facility] [phone] (Monday-Friday 8 am - 5 pm) [phone] (afterhours)

## 2015-07-25 DEATH — deceased

## 2016-04-11 ENCOUNTER — Other Ambulatory Visit: Payer: Self-pay | Admitting: Nurse Practitioner
